# Patient Record
Sex: Female | Born: 1965 | Hispanic: No | Marital: Married | Smoking: Never smoker
Health system: Southern US, Community
[De-identification: ages and names within clinical notes are randomized; demographics above are authoritative.]

## PROBLEM LIST (undated history)

## (undated) DIAGNOSIS — Z1371 Encounter for nonprocreative screening for genetic disease carrier status: Secondary | ICD-10-CM

## (undated) DIAGNOSIS — I471 Supraventricular tachycardia, unspecified: Secondary | ICD-10-CM

## (undated) DIAGNOSIS — E039 Hypothyroidism, unspecified: Secondary | ICD-10-CM

## (undated) DIAGNOSIS — L719 Rosacea, unspecified: Secondary | ICD-10-CM

## (undated) DIAGNOSIS — Z803 Family history of malignant neoplasm of breast: Secondary | ICD-10-CM

## (undated) DIAGNOSIS — E282 Polycystic ovarian syndrome: Secondary | ICD-10-CM

## (undated) HISTORY — DX: Rosacea, unspecified: L71.9

## (undated) HISTORY — DX: Family history of malignant neoplasm of breast: Z80.3

## (undated) HISTORY — PX: TONSILLECTOMY AND ADENOIDECTOMY: SUR1326

## (undated) HISTORY — PX: HYSTEROSCOPY: SHX211

## (undated) HISTORY — PX: DILATION AND CURETTAGE OF UTERUS: SHX78

## (undated) HISTORY — DX: Encounter for nonprocreative screening for genetic disease carrier status: Z13.71

---

## 1997-06-10 HISTORY — PX: CARDIAC ELECTROPHYSIOLOGY MAPPING AND ABLATION: SHX1292

## 2001-06-10 HISTORY — PX: CHOLECYSTECTOMY: SHX55

## 2016-05-10 ENCOUNTER — Other Ambulatory Visit: Payer: Self-pay | Admitting: Certified Nurse Midwife

## 2016-05-10 DIAGNOSIS — Z1231 Encounter for screening mammogram for malignant neoplasm of breast: Secondary | ICD-10-CM

## 2016-05-23 ENCOUNTER — Encounter: Payer: Self-pay | Admitting: Radiology

## 2016-05-23 ENCOUNTER — Ambulatory Visit
Admission: RE | Admit: 2016-05-23 | Discharge: 2016-05-23 | Disposition: A | Discharge status: Home / Self Care | Payer: 59 | Source: Ambulatory Visit | Attending: Certified Nurse Midwife | Admitting: Certified Nurse Midwife

## 2016-05-23 DIAGNOSIS — Z1231 Encounter for screening mammogram for malignant neoplasm of breast: Secondary | ICD-10-CM

## 2016-05-29 ENCOUNTER — Inpatient Hospital Stay
Admission: RE | Admit: 2016-05-29 | Discharge: 2016-05-29 | Disposition: A | Discharge status: Home / Self Care | Payer: Self-pay | Source: Ambulatory Visit | Attending: *Deleted | Admitting: *Deleted

## 2016-05-29 ENCOUNTER — Other Ambulatory Visit: Payer: Self-pay | Admitting: *Deleted

## 2016-05-29 DIAGNOSIS — Z9289 Personal history of other medical treatment: Secondary | ICD-10-CM

## 2016-12-07 ENCOUNTER — Ambulatory Visit (INDEPENDENT_AMBULATORY_CARE_PROVIDER_SITE_OTHER): Payer: 59

## 2016-12-07 ENCOUNTER — Ambulatory Visit
Admission: EM | Admit: 2016-12-07 | Discharge: 2016-12-07 | Disposition: A | Discharge status: Home / Self Care | Payer: 59 | Attending: Family Medicine | Admitting: Family Medicine

## 2016-12-07 DIAGNOSIS — G8929 Other chronic pain: Secondary | ICD-10-CM | POA: Diagnosis not present

## 2016-12-07 DIAGNOSIS — S336XXA Sprain of sacroiliac joint, initial encounter: Secondary | ICD-10-CM

## 2016-12-07 DIAGNOSIS — M533 Sacrococcygeal disorders, not elsewhere classified: Secondary | ICD-10-CM

## 2016-12-07 HISTORY — DX: Polycystic ovarian syndrome: E28.2

## 2016-12-07 HISTORY — DX: Supraventricular tachycardia: I47.1

## 2016-12-07 HISTORY — DX: Hypothyroidism, unspecified: E03.9

## 2016-12-07 HISTORY — DX: Supraventricular tachycardia, unspecified: I47.10

## 2016-12-07 MED ORDER — TRAMADOL HCL 50 MG PO TABS: 50.0000 mg | ORAL_TABLET | Freq: Four times a day (QID) | ORAL | 0 refills | Status: DC | PRN

## 2016-12-07 MED ORDER — MELOXICAM 15 MG PO TABS: 15.0000 mg | ORAL_TABLET | Freq: Every day | ORAL | 0 refills | Status: DC

## 2016-12-07 NOTE — ED Triage Notes (Signed)
Pt having right hip pain radiating around her right side. Not due to an injury and did have this problem before a while back and said it got better on it's own. Hard to bare weight on her right side but said it does help to hold her right hip when she walks to alleviate the pain. Took 2 ibuprofen this morning without relief.

## 2016-12-07 NOTE — ED Provider Notes (Signed)
[facility]    CSN: [id number] Arrival date & time: 12/07/16  0844     History   Chief Complaint Chief Complaint  Patient presents with  . Hip Pain    HPI Hayley Hale is a 51 y.o. female.   The history is provided by the patient.  Hip Pain  This is a new problem. The current episode started yesterday. The problem occurs constantly. Pertinent negatives include no abdominal pain, no headaches and no shortness of breath. The symptoms are aggravated by twisting, standing and bending. Nothing relieves the symptoms.    Past Medical History:  Diagnosis Date  . Hypothyroidism   . PCOS (polycystic ovarian syndrome)   . SVT (supraventricular tachycardia) (HCC)     There are no active problems to display for this patient.   Past Surgical History:  Procedure Laterality Date  . CARDIAC ELECTROPHYSIOLOGY MAPPING AND ABLATION    . CHOLECYSTECTOMY    . TONSILLECTOMY      OB History    No data available       Home Medications    Prior to Admission medications   Medication Sig Start Date End Date Taking? Authorizing Provider  flecainide (TAMBOCOR) 50 MG tablet Take 50 mg by mouth 2 (two) times daily.   Yes [provider]  levothyroxine (SYNTHROID, LEVOTHROID) 200 MCG tablet Take 200 mcg by mouth daily before breakfast.   Yes [provider]  metFORMIN (GLUCOPHAGE) 1000 MG tablet Take 1,000 mg by mouth 2 (two) times daily with a meal.   Yes [provider]  metoprolol tartrate (LOPRESSOR) 25 MG tablet Take 25 mg by mouth 2 (two) times daily.   Yes [provider]  spironolactone (ALDACTONE) 50 MG tablet Take 50 mg by mouth daily.   Yes [provider]  meloxicam (MOBIC) 15 MG tablet Take 1 tablet (15 mg total) by mouth daily. 12/07/16   Juline Patch, MD  traMADol (ULTRAM) 50 MG tablet Take 1 tablet (50 mg total) by mouth every 6 (six) hours as needed. 12/07/16   Juline Patch, MD    Family History Family  History  Problem Relation Age of Onset  . Breast cancer Sister 15       pat half sister  . Breast cancer Maternal Grandmother 32    Social History Social History  Substance Use Topics  . Smoking status: Never Smoker  . Smokeless tobacco: Never Used  . Alcohol use No     Allergies   Patient has no known allergies.   Review of Systems Review of Systems  Constitutional: Negative.  Negative for chills, fatigue, fever and unexpected weight change.  HENT: Negative for congestion, ear discharge, ear pain, rhinorrhea, sinus pressure, sneezing and sore throat.   Eyes: Negative for photophobia, pain, discharge, redness and itching.  Respiratory: Negative for cough, shortness of breath, wheezing and stridor.   Gastrointestinal: Negative for abdominal pain, blood in stool, constipation, diarrhea, nausea and vomiting.  Endocrine: Negative for cold intolerance, heat intolerance, polydipsia, polyphagia and polyuria.  Genitourinary: Negative for dysuria, flank pain, frequency, hematuria, menstrual problem, pelvic pain, urgency, vaginal bleeding and vaginal discharge.  Musculoskeletal: Negative for arthralgias, back pain and myalgias.  Skin: Negative for rash.  Allergic/Immunologic: Negative for environmental allergies and food allergies.  Neurological: Negative for dizziness, weakness, light-headedness, numbness and headaches.  Hematological: Negative for adenopathy. Does not bruise/bleed easily.  Psychiatric/Behavioral: Negative for dysphoric mood. The patient is not nervous/anxious.      Physical Exam Triage  Vital Signs ED Triage Vitals  Enc Vitals Group     BP 12/07/16 0944 113/67     Pulse Rate 12/07/16 0944 85     Resp 12/07/16 0944 20     Temp 12/07/16 0944 98.5 F (36.9 C)     Temp Source 12/07/16 0944 Oral     SpO2 12/07/16 0944 99 %     Weight --      Height --      Head Circumference --      Peak Flow --      Pain Score 12/07/16 0947 7     Pain Loc --      Pain Edu?  --      Excl. in [place]? --    No data found.   Updated Vital Signs BP 113/67 (BP Location: Left Arm)   Pulse 85   Temp 98.5 F (36.9 C) (Oral)   Resp 20   SpO2 99%   Visual Acuity Right Eye Distance:   Left Eye Distance:   Bilateral Distance:    Right Eye Near:   Left Eye Near:    Bilateral Near:     Physical Exam  Constitutional: No distress.  HENT:  Head: Normocephalic and atraumatic.  Right Ear: External ear normal.  Left Ear: External ear normal.  Nose: Nose normal.  Mouth/Throat: Oropharynx is clear and moist.  Eyes: Conjunctivae and EOM are normal. Pupils are equal, round, and reactive to light. Right eye exhibits no discharge. Left eye exhibits no discharge.  Neck: Normal range of motion. Neck supple. No JVD present. No thyromegaly present.  Cardiovascular: Normal rate, regular rhythm, normal heart sounds and intact distal pulses.  Exam reveals no gallop and no friction rub.   No murmur heard. Pulmonary/Chest: Effort normal and breath sounds normal.  Abdominal: Soft. Bowel sounds are normal. She exhibits no mass. There is no tenderness. There is no guarding.  Musculoskeletal: Normal range of motion. She exhibits no edema.       Right hip: She exhibits tenderness.  Tender si  Lymphadenopathy:    She has no cervical adenopathy.  Neurological: She is alert. She has normal strength. No sensory deficit.  Reflex Scores:      Patellar reflexes are 2+ on the right side and 2+ on the left side. Skin: Skin is warm and dry. No rash noted. She is not diaphoretic.  Nursing note and vitals reviewed.    UC Treatments / Results  Labs (all labs ordered are listed, but only abnormal results are displayed) Labs Reviewed - No data to display  EKG  EKG Interpretation None       Radiology Dg Si Joints  Result Date: 12/07/2016 CLINICAL DATA:  RIGHT-side SI joint pain this morning after straining, similar episode last year EXAM: BILATERAL SACROILIAC JOINTS - 3+ VIEW  COMPARISON:  None FINDINGS: SI joints symmetric and preserved. Sacral foramina symmetric. Osseous mineralization grossly normal for technique. Probable bone island RIGHT ilium. No acute fracture, dislocation or bone destruction. Visualized lower lumbar spine unremarkable. IMPRESSION: No acute abnormalities. Electronically Signed   By: Lavonia Dana M.D.   On: 12/07/2016 10:47    Procedures Procedures (including critical care time)  Medications Ordered in UC Medications - No data to display   Initial Impression / Assessment and Plan / UC Course  I have reviewed the triage vital signs and the nursing notes.  Pertinent labs & imaging results that were available during my care of the patient were reviewed by me  and considered in my medical decision making (see chart for details).       Final Clinical Impressions(s) / UC Diagnoses   Final diagnoses:  Sacroiliac (ligament) sprain, initial encounter    New Prescriptions New Prescriptions   MELOXICAM (MOBIC) 15 MG TABLET    Take 1 tablet (15 mg total) by mouth daily.   TRAMADOL (ULTRAM) 50 MG TABLET    Take 1 tablet (50 mg total) by mouth every 6 (six) hours as needed.     Juline Patch, MD 12/07/16 1058

## 2017-02-19 ENCOUNTER — Encounter: Payer: Self-pay | Admitting: Family Medicine

## 2017-02-19 ENCOUNTER — Ambulatory Visit (INDEPENDENT_AMBULATORY_CARE_PROVIDER_SITE_OTHER): Payer: 59 | Admitting: Family Medicine

## 2017-02-19 VITALS — BP 100/62 | HR 68 | Ht 70.0 in | Wt 206.0 lb

## 2017-02-19 DIAGNOSIS — Z7689 Persons encountering health services in other specified circumstances: Secondary | ICD-10-CM | POA: Diagnosis not present

## 2017-02-19 DIAGNOSIS — Z23 Encounter for immunization: Secondary | ICD-10-CM

## 2017-02-19 NOTE — Patient Instructions (Signed)
Colonoscopy, Adult A colonoscopy is an exam to look at the entire large intestine. During the exam, a lubricated, bendable tube is inserted into the anus and then passed into the rectum, colon, and other parts of the large intestine. A colonoscopy is often done as a part of normal colorectal screening or in response to certain symptoms, such as anemia, persistent diarrhea, abdominal pain, and blood in the stool. The exam can help screen for and diagnose medical problems, including:  Tumors.  Polyps.  Inflammation.  Areas of bleeding.  Tell a health care provider about:  Any allergies you have.  All medicines you are taking, including vitamins, herbs, eye drops, creams, and over-the-counter medicines.  Any problems you or family members have had with anesthetic medicines.  Any blood disorders you have.  Any surgeries you have had.  Any medical conditions you have.  Any problems you have had passing stool. What are the risks? Generally, this is a safe procedure. However, problems may occur, including:  Bleeding.  A tear in the intestine.  A reaction to medicines given during the exam.  Infection (rare).  What happens before the procedure? Eating and drinking restrictions Follow instructions from your health care provider about eating and drinking, which may include:  A few days before the procedure - follow a low-fiber diet. Avoid nuts, seeds, dried fruit, raw fruits, and vegetables.  1-3 days before the procedure - follow a clear liquid diet. Drink only clear liquids, such as clear broth or bouillon, black coffee or tea, clear juice, clear soft drinks or sports drinks, gelatin dessert, and popsicles. Avoid any liquids that contain red or purple dye.  On the day of the procedure - do not eat or drink anything during the 2 hours before the procedure, or within the time period that your health care provider recommends.  Bowel prep If you were prescribed an oral bowel prep  to clean out your colon:  Take it as told by your health care provider. Starting the day before your procedure, you will need to drink a large amount of medicated liquid. The liquid will cause you to have multiple loose stools until your stool is almost clear or light green.  If your skin or anus gets irritated from diarrhea, you may use these to relieve the irritation: ? Medicated wipes, such as adult wet wipes with aloe and vitamin E. ? A skin soothing-product like petroleum jelly.  If you vomit while drinking the bowel prep, take a break for up to 60 minutes and then begin the bowel prep again. If vomiting continues and you cannot take the bowel prep without vomiting, call your health care provider.  General instructions  Ask your health care provider about changing or stopping your regular medicines. This is especially important if you are taking diabetes medicines or blood thinners.  Plan to have someone take you home from the hospital or clinic. What happens during the procedure?  An IV tube may be inserted into one of your veins.  You will be given medicine to help you relax (sedative).  To reduce your risk of infection: ? Your health care team will wash or sanitize their hands. ? Your anal area will be washed with soap.  You will be asked to lie on your side with your knees bent.  Your health care provider will lubricate a long, thin, flexible tube. The tube will have a camera and a light on the end.  The tube will be inserted into your   anus.  The tube will be gently eased through your rectum and colon.  Air will be delivered into your colon to keep it open. You may feel some pressure or cramping.  The camera will be used to take images during the procedure.  A small tissue sample may be removed from your body to be examined under a microscope (biopsy). If any potential problems are found, the tissue will be sent to a lab for testing.  If small polyps are found, your  health care provider may remove them and have them checked for cancer cells.  The tube that was inserted into your anus will be slowly removed. The procedure may vary among health care providers and hospitals. What happens after the procedure?  Your blood pressure, heart rate, breathing rate, and blood oxygen level will be monitored until the medicines you were given have worn off.  Do not drive for 24 hours after the exam.  You may have a small amount of blood in your stool.  You may pass gas and have mild abdominal cramping or bloating due to the air that was used to inflate your colon during the exam.  It is up to you to get the results of your procedure. Ask your health care provider, or the department performing the procedure, when your results will be ready. This information is not intended to replace advice given to you by your health care provider. Make sure you discuss any questions you have with your health care provider. Document Released: 05/24/2000 Document Revised: 03/27/2016 Document Reviewed: 08/08/2015 Elsevier Interactive Patient Education  2018 [employer]  

## 2017-02-19 NOTE — Progress Notes (Signed)
Name: Hayley Hale   MRN: [id number]    DOB: 10/20/1965   Date:02/19/2017       Progress Note  Subjective  Chief Complaint  Chief Complaint  Patient presents with  . Establish Care    was seen in urgent care for hip pain/ follow up from that    Patient presents to establish care.    No problem-specific Assessment & Plan notes found for this encounter.   Past Medical History:  Diagnosis Date  . Hypothyroidism   . PCOS (polycystic ovarian syndrome)   . SVT (supraventricular tachycardia) (HCC)     Past Surgical History:  Procedure Laterality Date  . CARDIAC ELECTROPHYSIOLOGY MAPPING AND ABLATION    . CHOLECYSTECTOMY    . HYSTEROSCOPY     x 1  . TONSILLECTOMY      Family History  Problem Relation Age of Onset  . Breast cancer Sister 63       pat half sister  . Cancer Sister   . Breast cancer Maternal Grandmother 43  . Cancer Maternal Grandmother   . Hypertension Mother   . Hypertension Father   . Cancer Paternal Grandmother     Social History   Social History  . Marital status: Married    Spouse name: N/A  . Number of children: N/A  . Years of education: N/A   Occupational History  . Not on file.   Social History Main Topics  . Smoking status: Never Smoker  . Smokeless tobacco: Never Used  . Alcohol use No  . Drug use: No  . Sexual activity: Yes   Other Topics Concern  . Not on file   Social History Narrative  . No narrative on file    No Known Allergies  Outpatient Medications Prior to Visit  Medication Sig Dispense Refill  . flecainide (TAMBOCOR) 50 MG tablet Take 50 mg by mouth 2 (two) times daily.    Marland Kitchen levothyroxine (SYNTHROID, LEVOTHROID) 200 MCG tablet Take 200 mcg by mouth daily before breakfast.    . metFORMIN (GLUCOPHAGE) 1000 MG tablet Take 1,000 mg by mouth 2 (two) times daily with a meal.    . metoprolol tartrate (LOPRESSOR) 25 MG tablet Take 12.5 mg by mouth 2 (two) times daily.     Marland Kitchen spironolactone (ALDACTONE) 50 MG tablet  Take 50 mg by mouth daily.    . meloxicam (MOBIC) 15 MG tablet Take 1 tablet (15 mg total) by mouth daily. (Patient not taking: Reported on 02/19/2017) 30 tablet 0  . traMADol (ULTRAM) 50 MG tablet Take 1 tablet (50 mg total) by mouth every 6 (six) hours as needed. (Patient not taking: Reported on 02/19/2017) 15 tablet 0   No facility-administered medications prior to visit.     Review of Systems  Constitutional: Negative for chills, fever, malaise/fatigue and weight loss.  HENT: Negative for ear discharge, ear pain and sore throat.   Eyes: Negative for blurred vision.  Respiratory: Negative for cough, sputum production, shortness of breath and wheezing.   Cardiovascular: Positive for palpitations. Negative for chest pain and leg swelling.  Gastrointestinal: Negative for abdominal pain, blood in stool, constipation, diarrhea, heartburn, melena and nausea.  Genitourinary: Negative for dysuria, frequency, hematuria and urgency.       Hx nephrolithiasis  Musculoskeletal: Positive for back pain and joint pain. Negative for myalgias and neck pain.  Skin: Negative for rash.  Neurological: Positive for headaches. Negative for dizziness, tingling, sensory change and focal weakness.       Sinus  Endo/Heme/Allergies: Negative for environmental allergies and polydipsia. Does not bruise/bleed easily.  Psychiatric/Behavioral: Negative for depression and suicidal ideas. The patient is not nervous/anxious and does not have insomnia.      Objective  Vitals:   02/19/17 1005  BP: 100/62  Pulse: 68  Weight: 206 lb (93.4 kg)  Height: 5\' 10"  (1.778 m)    Physical Exam  Constitutional: She is well-developed, well-nourished, and in no distress. No distress.  HENT:  Head: Normocephalic and atraumatic.  Right Ear: External ear normal.  Left Ear: External ear normal.  Nose: Nose normal.  Mouth/Throat: Oropharynx is clear and moist.  Eyes: Pupils are equal, round, and reactive to light. Conjunctivae  and EOM are normal. Right eye exhibits no discharge. Left eye exhibits no discharge.  Neck: Normal range of motion. Neck supple. No JVD present. No thyromegaly present.  Cardiovascular: Normal rate, regular rhythm, normal heart sounds and intact distal pulses.  Exam reveals no gallop and no friction rub.   No murmur heard. Pulmonary/Chest: Effort normal and breath sounds normal. She has no wheezes. She has no rales.  Abdominal: Soft. Bowel sounds are normal. She exhibits no mass. There is no tenderness. There is no guarding.  Musculoskeletal: Normal range of motion. She exhibits no edema.  Lymphadenopathy:    She has no cervical adenopathy.  Neurological: She is alert. She has normal reflexes.  Skin: Skin is warm and dry. She is not diaphoretic.  Psychiatric: Mood and affect normal.  Nursing note and vitals reviewed.     Assessment & Plan  Problem List Items Addressed This Visit    None    Visit Diagnoses    Establishing care with new doctor, encounter for    -  Primary   Influenza vaccine needed       Relevant Orders   Flu Vaccine QUAD 6+ mos PF IM (Fluarix Quad PF) (Completed)      No orders of the defined types were placed in this encounter.     Dr. Macon [facility]  02/19/17

## 2017-02-21 ENCOUNTER — Ambulatory Visit: Payer: Self-pay | Admitting: Family Medicine

## 2017-04-09 ENCOUNTER — Other Ambulatory Visit: Payer: Self-pay | Admitting: Certified Nurse Midwife

## 2017-04-09 DIAGNOSIS — Z1231 Encounter for screening mammogram for malignant neoplasm of breast: Secondary | ICD-10-CM

## 2017-05-10 DIAGNOSIS — Z803 Family history of malignant neoplasm of breast: Secondary | ICD-10-CM

## 2017-05-10 DIAGNOSIS — Z1371 Encounter for nonprocreative screening for genetic disease carrier status: Secondary | ICD-10-CM

## 2017-05-10 HISTORY — DX: Family history of malignant neoplasm of breast: Z80.3

## 2017-05-10 HISTORY — DX: Encounter for nonprocreative screening for genetic disease carrier status: Z13.71

## 2017-05-12 ENCOUNTER — Ambulatory Visit (INDEPENDENT_AMBULATORY_CARE_PROVIDER_SITE_OTHER): Payer: 59 | Admitting: Certified Nurse Midwife

## 2017-05-12 ENCOUNTER — Encounter: Payer: Self-pay | Admitting: Certified Nurse Midwife

## 2017-05-12 VITALS — BP 110/66 | HR 67 | Ht 70.0 in | Wt 208.0 lb

## 2017-05-12 DIAGNOSIS — Z1211 Encounter for screening for malignant neoplasm of colon: Secondary | ICD-10-CM | POA: Diagnosis not present

## 2017-05-12 DIAGNOSIS — Z803 Family history of malignant neoplasm of breast: Secondary | ICD-10-CM | POA: Insufficient documentation

## 2017-05-12 DIAGNOSIS — I471 Supraventricular tachycardia: Secondary | ICD-10-CM | POA: Insufficient documentation

## 2017-05-12 DIAGNOSIS — Z01419 Encounter for gynecological examination (general) (routine) without abnormal findings: Secondary | ICD-10-CM | POA: Diagnosis not present

## 2017-05-12 DIAGNOSIS — Z1239 Encounter for other screening for malignant neoplasm of breast: Secondary | ICD-10-CM

## 2017-05-12 DIAGNOSIS — Z124 Encounter for screening for malignant neoplasm of cervix: Secondary | ICD-10-CM

## 2017-05-12 DIAGNOSIS — L719 Rosacea, unspecified: Secondary | ICD-10-CM | POA: Insufficient documentation

## 2017-05-12 DIAGNOSIS — Z23 Encounter for immunization: Secondary | ICD-10-CM

## 2017-05-12 DIAGNOSIS — E282 Polycystic ovarian syndrome: Secondary | ICD-10-CM | POA: Insufficient documentation

## 2017-05-12 DIAGNOSIS — E039 Hypothyroidism, unspecified: Secondary | ICD-10-CM | POA: Insufficient documentation

## 2017-05-12 NOTE — Progress Notes (Signed)
Gynecology Annual Exam  PCP: Juline Patch, MD  Chief Complaint:  Chief Complaint  Patient presents with  . Gynecologic Exam    History of Present Illness:Hayley Hale is a 51 year old Caucasian/White female , G 5 P 2 0 3 2 , who presents for her annual exam . She is having problems with irregular menses and hot flashes.  Her menses went back to being infrequent this past year. They are every 60-75 days apart, last 3-4 days and are light and her LMP was 04/29/2017.   She denies dysmenorrhea. She occasionally feels some pressure in her pelvis and uses ibuprofen with symptomatic relief.  The patient's past medical history is notable for a history of hypothyroidism, PCOS, rosacea, and PSVT.  Since her last annual GYN exam dated 05/09/2016, she has had no significant changes in her health history. Has been having some problems with pain in her right SI joint.  She is sexually active. She is currently using condoms for contraception.  Her most recent pap smear was obtained 05/09/2016 and was NIL/ negative HRHPV. Her most recent mammogram obtained on 05/23/2016 was normal.  There is a positive history of breast cancer in her maternal grandmother and half sister. Genetic testing has been done. The half sister tested negative for BRCA 1 and negative for BRCA 2.  There is no family history of ovarian cancer.  The patient does do occ self breast exams.  The patient does not smoke.  The patient does not drink alcohol.  The patient does not use illegal drugs.  The patient exercises most days of the week. The patient does get adequate calcium in her diet.  She had a recent cholesterol screen a few years ago that was normal per patient report.    The patient denies current symptoms of depression.    Review of Systems: Review of Systems  Constitutional: Negative for chills, fever and weight loss.  HENT: Negative for congestion, sinus pain and sore throat.   Eyes: Negative for  blurred vision and pain.  Respiratory: Negative for hemoptysis, shortness of breath and wheezing.   Cardiovascular: Negative for chest pain, palpitations and leg swelling.  Gastrointestinal: Negative for abdominal pain, blood in stool, diarrhea, heartburn, nausea and vomiting.  Genitourinary: Negative for dysuria, frequency, hematuria and urgency.       Positive for oligomenorrhea  Musculoskeletal: Positive for back pain (right SI joint pain). Negative for joint pain and myalgias.  Skin: Negative for itching and rash.  Neurological: Negative for dizziness, tingling and headaches.  Endo/Heme/Allergies: Positive for environmental allergies. Negative for polydipsia. Does not bruise/bleed easily.       Positive for hot flashes   Psychiatric/Behavioral: Negative for depression. The patient is not nervous/anxious and does not have insomnia.     Past Medical History:  Past Medical History:  Diagnosis Date  . Hypothyroidism   . PCOS (polycystic ovarian syndrome)   . Rosacea   . SVT (supraventricular tachycardia) (HCC)     Past Surgical History:  Past Surgical History:  Procedure Laterality Date  . CARDIAC ELECTROPHYSIOLOGY [place] AND ABLATION  1999  . CHOLECYSTECTOMY  06/2001  . DILATION AND CURETTAGE OF UTERUS  multiple in 1997-98  . HYSTEROSCOPY     x 1  . TONSILLECTOMY AND ADENOIDECTOMY      Family History:  Family History  Problem Relation Age of Onset  . Breast cancer Sister 3       pat half sister  . Breast  cancer Maternal Grandmother 7  . Hypertension Mother   . Hyperlipidemia Mother   . Hypertension Father   . Hyperlipidemia Father   . Pancreatic cancer Paternal Grandmother 34  . Colon cancer Other     Social History:  Social History   Socioeconomic History  . Marital status: Married    Spouse name: Ruthann Cancer  . Number of children: 2  . Years of education: Not on file  . Highest education level: Not on file  Social Needs  . Financial resource strain: Not  on file  . Food insecurity - worry: Not on file  . Food insecurity - inability: Not on file  . Transportation needs - medical: Not on file  . Transportation needs - non-medical: Not on file  Occupational History  . Occupation: Homemaker  Tobacco Use  . Smoking status: Never Smoker  . Smokeless tobacco: Never Used  Substance and Sexual Activity  . Alcohol use: No  . Drug use: No  . Sexual activity: Yes    Partners: Male    Birth control/protection: Condom  Other Topics Concern  . Not on file  Social History Narrative  . Not on file    Allergies:  No Known Allergies  Medications: Prior to Admission medications   Medication Sig Start Date End Date Taking? Authorizing Provider  B Complex Vitamins (VITAMIN-B COMPLEX) TABS Take 1 tablet by mouth daily.    [provider]  Cholecalciferol (VITAMIN D3) 5000 units CAPS Take 1 capsule by mouth daily.    [provider]  flecainide (TAMBOCOR) 50 MG tablet Take 50 mg by mouth 2 (two) times daily.    [provider]  Ivermectin (SOOLANTRA) 1 % CREA Apply 1 application topically daily. Dr Phillip Heal    [provider]  levothyroxine (SYNTHROID, LEVOTHROID) 200 MCG tablet Take 200 mcg by mouth daily before breakfast.    [provider]  meloxicam (MOBIC) 15 MG tablet Take 1 tablet (15 mg total) by mouth daily. Patient not taking: Reported on 02/19/2017 12/07/16   Juline Patch, MD  metFORMIN (GLUCOPHAGE) 1000 MG tablet Take 1,000 mg by mouth 2 (two) times daily with a meal.    [provider]  metoprolol tartrate (LOPRESSOR) 25 MG tablet Take 12.5 mg by mouth 2 (two) times daily.     [provider]  spironolactone (ALDACTONE) 50 MG tablet Take 50 mg by mouth daily.    [provider]  traMADol (ULTRAM) 50 MG tablet Take 1 tablet (50 mg total) by mouth every 6 (six) hours as needed. Patient not taking: Reported on 02/19/2017 12/07/16   Juline Patch, MD  tretinoin (RETIN-A)  0.1 % cream Apply topically at bedtime. Dr Phillip Heal    [provider]    Physical Exam Vitals: BP 110/66   Pulse 67   Ht '5\' 10"'$  (1.778 m)   Wt 208 lb (94.3 kg)   LMP 04/29/2017 (Exact Date)   BMI 29.84 kg/m   General: WF in NAD HEENT: normocephalic, anicteric Neck: no thyroid enlargement, no palpable nodules, no cervical lymphadenopathy  Pulmonary: No increased work of breathing, CTAB Cardiovascular: RRR, without murmur  Breast: Breast symmetrical, no tenderness, no palpable nodules or masses, no skin or nipple retraction present, no nipple discharge.  No axillary, infraclavicular or supraclavicular lymphadenopathy. Abdomen: Soft, non-tender, non-distended.  Umbilicus without lesions.  No hepatomegaly or masses palpable. No evidence of hernia. Genitourinary:  External: Normal external female genitalia.  Normal urethral meatus, normal Bartholin's and Skene's glands.  Vagina: Normal vaginal mucosa, no evidence of prolapse.    Cervix: Grossly normal in appearance, no bleeding, non-tender  Uterus: Anteverted, normal size, shape, and consistency, mobile, and non-tender  Adnexa: No adnexal masses, non-tender  Rectal: deferred  Lymphatic: no evidence of inguinal lymphadenopathy Extremities: no edema, erythema, or tenderness Neurologic: Grossly intact Psychiatric: mood appropriate, affect full     Assessment: 51 y.o. annual gyn exam Oligomenorrhea/ vasomotor symptoms-perimenopausal Family history of breast cancer Desires TDAP vaccine   Plan:   1) Breast cancer screening - recommend monthly self breast exam and annual screening mammograms. Mammogram was ordered today. Patient to call for appointment at [facility]. Recommended MYRISK testing with family history of breast cancer. Patient desires testing today. Blood work drawn. RTO in 6 weeks for test results  2) Colon cancer screening: discussed options for colon cancer screening. Desires referral for colonoscopy  3)  Cervical cancer screening - Pap was done.   4) Contraception - advised needs to continue use of condoms until no menses x 1 year  5) Routine healthcare maintenance including cholesterol and diabetes screening managed by PCP . TDAP given  Dalia Heading, CNM

## 2017-05-12 NOTE — Patient Instructions (Addendum)
[place] testing done today-return to office in 6 weeks for results  You will be contacted regarding a [facility] gastroenterology appointment for a colonoscopy

## 2017-05-15 LAB — IGP,RFX APTIMA HPV ALL PTH: PAP Smear Comment: 0

## 2017-05-27 ENCOUNTER — Encounter: Payer: Self-pay | Admitting: Obstetrics and Gynecology

## 2017-06-11 ENCOUNTER — Ambulatory Visit
Admission: RE | Admit: 2017-06-11 | Discharge: 2017-06-11 | Disposition: A | Discharge status: Home / Self Care | Payer: Managed Care, Other (non HMO) | Source: Ambulatory Visit | Attending: Certified Nurse Midwife | Admitting: Certified Nurse Midwife

## 2017-06-11 DIAGNOSIS — Z1231 Encounter for screening mammogram for malignant neoplasm of breast: Secondary | ICD-10-CM | POA: Diagnosis not present

## 2017-06-25 ENCOUNTER — Encounter: Payer: Self-pay | Admitting: Certified Nurse Midwife

## 2017-06-25 ENCOUNTER — Ambulatory Visit: Payer: Managed Care, Other (non HMO) | Admitting: Certified Nurse Midwife

## 2017-06-25 VITALS — BP 102/70 | HR 65 | Ht 70.0 in | Wt 207.0 lb

## 2017-06-25 DIAGNOSIS — Z803 Family history of malignant neoplasm of breast: Secondary | ICD-10-CM

## 2017-06-25 DIAGNOSIS — Z1379 Encounter for other screening for genetic and chromosomal anomalies: Secondary | ICD-10-CM

## 2017-06-25 NOTE — Progress Notes (Signed)
  History of Present Illness:  Hayley Hale is a 52 y.o. who presents for her MYRISK test results.  PMHx: She  has a past medical history of BRCA negative (05/2017), Family history of breast cancer (05/2017), Hypothyroidism, PCOS (polycystic ovarian syndrome), Rosacea, and SVT (supraventricular tachycardia) ([place]). Also,  has a past surgical history that includes Cardiac electrophysiology mapping and ablation (1999); Cholecystectomy (06/2001); Hysteroscopy; Dilation and curettage of uterus (multiple in 1997-98); and Tonsillectomy and adenoidectomy., family history includes Breast cancer (age of onset: 43) in her maternal grandmother; Breast cancer (age of onset: 75) in her sister; Colon cancer in her other; Hyperlipidemia in her father and mother; Hypertension in her father and mother; Pancreatic cancer (age of onset: 33) in her paternal grandmother.,  reports that  has never smoked. she has never used smokeless tobacco. She reports that she does not drink alcohol or use drugs.  She has a current medication list which includes the following prescription(s): vitamin-b complex, vitamin d3, flecainide, ivermectin, levothyroxine, loratadine, metformin, metoprolol tartrate, spironolactone, tazarotene, and triamcinolone. Also, has No Known Allergies.  ROS  Physical Exam:  BP 102/70   Pulse 65   Ht '5\' 10"'$  (1.778 m)   Wt 93.9 kg (207 lb)   LMP 05/28/2017 (Exact Date) Comment: periods are irregular not preg  BMI 29.70 kg/m  Body mass index is 29.7 kg/m. Constitutional: Well nourished, well developed female in no acute distress.  Abdomen: diffusely non tender to palpation, non distended, and no masses, hernias Neuro: Grossly intact Psych:  Normal mood and affect.   Results: MyRISK test was negative. Her life time risk of breast cancer according to [facility] is 18.9%.  The lifetime risk of breast cancer according to the [facility] is 25.4%.  Assessment: Family history of breast cancer  Negative MYRISK  testing, but lifetime risk of breast cancer between 18.9% and 25.4%  Plan: Discussed increased surveillance available for breast cancer in the form of MRIs of the breast and twice a year professional breast exams. 3D mammogram 06/11/2017 was negative. Desires MRI of the breast-will call for referral after she completes her colonoscopy Given copy of the results for her resords  Dalia Heading, [facility], [facility] 06/25/2017  10:36 AM

## 2017-06-26 ENCOUNTER — Encounter: Payer: Self-pay | Admitting: Certified Nurse Midwife

## 2017-08-05 ENCOUNTER — Ambulatory Visit: Payer: Managed Care, Other (non HMO) | Admitting: Certified Registered Nurse Anesthetist

## 2017-08-05 ENCOUNTER — Encounter
Admission: RE | Disposition: A | Discharge status: Home / Self Care | Payer: Self-pay | Source: Ambulatory Visit | Attending: Internal Medicine

## 2017-08-05 ENCOUNTER — Ambulatory Visit
Admission: RE | Admit: 2017-08-05 | Discharge: 2017-08-05 | Disposition: A | Discharge status: Home / Self Care | Payer: Managed Care, Other (non HMO) | Source: Ambulatory Visit | Attending: Internal Medicine | Admitting: Internal Medicine

## 2017-08-05 ENCOUNTER — Encounter: Payer: Self-pay | Admitting: Certified Registered Nurse Anesthetist

## 2017-08-05 ENCOUNTER — Other Ambulatory Visit: Payer: Self-pay

## 2017-08-05 DIAGNOSIS — E282 Polycystic ovarian syndrome: Secondary | ICD-10-CM | POA: Diagnosis not present

## 2017-08-05 DIAGNOSIS — Z8371 Family history of colonic polyps: Secondary | ICD-10-CM | POA: Diagnosis not present

## 2017-08-05 DIAGNOSIS — Q438 Other specified congenital malformations of intestine: Secondary | ICD-10-CM | POA: Diagnosis not present

## 2017-08-05 DIAGNOSIS — E039 Hypothyroidism, unspecified: Secondary | ICD-10-CM | POA: Diagnosis not present

## 2017-08-05 DIAGNOSIS — K640 First degree hemorrhoids: Secondary | ICD-10-CM | POA: Diagnosis not present

## 2017-08-05 DIAGNOSIS — Z79899 Other long term (current) drug therapy: Secondary | ICD-10-CM | POA: Insufficient documentation

## 2017-08-05 DIAGNOSIS — Z7984 Long term (current) use of oral hypoglycemic drugs: Secondary | ICD-10-CM | POA: Diagnosis not present

## 2017-08-05 DIAGNOSIS — I471 Supraventricular tachycardia: Secondary | ICD-10-CM | POA: Diagnosis not present

## 2017-08-05 DIAGNOSIS — Z1211 Encounter for screening for malignant neoplasm of colon: Secondary | ICD-10-CM | POA: Insufficient documentation

## 2017-08-05 HISTORY — PX: COLONOSCOPY WITH PROPOFOL: SHX5780

## 2017-08-05 LAB — POCT PREGNANCY, URINE: PREG TEST UR: NEGATIVE

## 2017-08-05 SURGERY — COLONOSCOPY WITH PROPOFOL
Anesthesia: General

## 2017-08-05 MED ORDER — SODIUM CHLORIDE 0.9 % IV SOLN
INTRAVENOUS | Status: DC
  Administered 2017-08-05: 13:00:00 via INTRAVENOUS

## 2017-08-05 MED ORDER — PROPOFOL 500 MG/50ML IV EMUL
INTRAVENOUS | Status: DC | PRN
  Administered 2017-08-05: 200 ug/kg/min via INTRAVENOUS

## 2017-08-05 MED ORDER — LIDOCAINE HCL (PF) 2 % IJ SOLN
INTRAMUSCULAR | Status: AC
  Filled 2017-08-05: qty 10

## 2017-08-05 MED ORDER — PROPOFOL 500 MG/50ML IV EMUL
INTRAVENOUS | Status: AC
  Filled 2017-08-05: qty 50

## 2017-08-05 MED ORDER — LIDOCAINE HCL (CARDIAC) 20 MG/ML IV SOLN
INTRAVENOUS | Status: DC | PRN
  Administered 2017-08-05: 50 mg via INTRAVENOUS

## 2017-08-05 MED ORDER — PROPOFOL 10 MG/ML IV BOLUS
INTRAVENOUS | Status: DC | PRN
  Administered 2017-08-05: 60 mg via INTRAVENOUS
  Administered 2017-08-05 (×2): 20 mg via INTRAVENOUS

## 2017-08-05 NOTE — Anesthesia Post-op Follow-up Note (Signed)
Anesthesia QCDR form completed.        

## 2017-08-05 NOTE — Interval H&P Note (Signed)
History and Physical Interval Note:  08/05/2017 1:09 PM  Hayley Hale  has presented today for surgery, with the diagnosis of FAM HX POLYPS  The various methods of treatment have been discussed with the patient and family. After consideration of risks, benefits and other options for treatment, the patient has consented to  Procedure(s): COLONOSCOPY WITH PROPOFOL (N/A) as a surgical intervention .  The patient's history has been reviewed, patient examined, no change in status, stable for surgery.  I have reviewed the patient's chart and labs.  Questions were answered to the patient's satisfaction.     [place], [place]

## 2017-08-05 NOTE — Anesthesia Procedure Notes (Signed)
Date/Time: 08/05/2017 1:20 PM Performed by: Johnna Acosta, CRNA Pre-anesthesia Checklist: Patient identified, Emergency Drugs available, Suction available, Patient being monitored and Timeout performed Patient Re-evaluated:Patient Re-evaluated prior to induction Oxygen Delivery Method: Nasal cannula Preoxygenation: Pre-oxygenation with 100% oxygen

## 2017-08-05 NOTE — Op Note (Signed)
[facility] Patient Name: Hayley Hale Procedure Date: 08/05/2017 1:17 PM MRN: [id number] Account #: [id number] Date of Birth: February 22, 1966 Admit Type: Outpatient Age: 52 Room: [facility] ENDO ROOM 2 Gender: Female Note Status: Finalized Procedure:            Colonoscopy Indications:          Colon cancer screening in patient at increased risk:                        Family history of 1st-degree relative with colon polyps Providers:            Benay Pike. [name] MD, MD Referring MD:         Juline Patch, MD (Referring MD) Medicines:            Propofol per Anesthesia Complications:        No immediate complications. Procedure:            Pre-Anesthesia Assessment:                       - The risks and benefits of the procedure and the                        sedation options and risks were discussed with the                        patient. All questions were answered and informed                        consent was obtained.                       - Patient identification and proposed procedure were                        verified prior to the procedure by the nurse. The                        procedure was verified in the procedure room.                       - ASA Grade Assessment: II - A patient with mild                        systemic disease.                       - After reviewing the risks and benefits, the patient                        was deemed in satisfactory condition to undergo the                        procedure.                       After obtaining informed consent, the colonoscope was                        passed under direct vision. Throughout the procedure,  the patient's blood pressure, pulse, and oxygen                        saturations were monitored continuously. The                        Colonoscope was introduced through the anus and                        advanced to the the cecum, identified by  appendiceal                        orifice and ileocecal valve. The colonoscopy was                        technically difficult and complex due to significant                        looping. Successful completion of the procedure was                        aided by applying abdominal pressure. The patient                        tolerated the procedure well. The quality of the bowel                        preparation was good. Findings:      The perianal and digital rectal examinations were normal. Pertinent       negatives include normal sphincter tone and no palpable rectal lesions.      The colon (entire examined portion) appeared normal.      Non-bleeding internal hemorrhoids were found during retroflexion. The       hemorrhoids were Grade I (internal hemorrhoids that do not prolapse).      The exam was otherwise without abnormality. Impression:           - The entire examined colon is normal.                       - Non-bleeding internal hemorrhoids.                       - The examination was otherwise normal.                       - No specimens collected. Recommendation:       - Patient has a contact number available for                        emergencies. The signs and symptoms of potential                        delayed complications were discussed with the patient.                        Return to normal activities tomorrow. Written discharge                        instructions were provided to the patient.                       -  Resume previous diet.                       - Continue present medications.                       - Repeat colonoscopy in 5 years for screening purposes.                       - Return to GI office PRN.                       - The findings and recommendations were discussed with                        the patient and their spouse. Procedure Code(s):    --- Professional ---                       X6468, Colorectal cancer screening; colonoscopy on                         individual at high risk Diagnosis Code(s):    --- Professional ---                       Z83.71, Family history of colonic polyps                       K64.0, First degree hemorrhoids CPT copyright 2016 American Medical Association. All rights reserved. The codes documented in this report are preliminary and upon coder review may  be revised to meet current compliance requirements. Efrain Sella MD, MD 08/05/2017 1:52:22 PM This report has been signed electronically. Number of Addenda: 0 Note Initiated On: 08/05/2017 1:17 PM Scope Withdrawal Time: 0 hours 8 minutes 32 seconds  Total Procedure Duration: 0 hours 22 minutes 42 seconds       [facility]

## 2017-08-05 NOTE — Anesthesia Preprocedure Evaluation (Signed)
Anesthesia Evaluation  Patient identified by MRN, date of birth, ID band Patient awake    Reviewed: Allergy & Precautions, NPO status , Patient's Chart, lab work & pertinent test results  History of Anesthesia Complications Negative for: history of anesthetic complications  Airway Mallampati: III  TM Distance: >3 FB Neck ROM: Full    Dental no notable dental hx.    Pulmonary neg pulmonary ROS, neg sleep apnea, neg COPD,    breath sounds clear to auscultation- rhonchi (-) wheezing      Cardiovascular Exercise Tolerance: Good (-) hypertension(-) CAD and (-) Past MI  Rhythm:Regular Rate:Normal - Systolic murmurs and - Diastolic murmurs    Neuro/Psych negative neurological ROS  negative psych ROS   GI/Hepatic negative GI ROS, Neg liver ROS,   Endo/Other  neg diabetesHypothyroidism   Renal/GU negative Renal ROS     Musculoskeletal negative musculoskeletal ROS (+)   Abdominal (+) - obese,   Peds  Hematology negative hematology ROS (+)   Anesthesia Other Findings Past Medical History: 05/2017: BRCA negative     Comment:  MyRisk neg 05/2017: Family history of breast cancer     Comment:  riskscore=18.9%/IBIS=25.4% No date: Hypothyroidism No date: PCOS (polycystic ovarian syndrome) No date: Rosacea No date: SVT (supraventricular tachycardia) (HCC)   Reproductive/Obstetrics                             Anesthesia Physical Anesthesia Plan  ASA: II  Anesthesia Plan: General   Post-op Pain Management:    Induction: Intravenous  PONV Risk Score and Plan: 2 and Propofol infusion  Airway Management Planned: Natural Airway  Additional Equipment:   Intra-op Plan:   Post-operative Plan:   Informed Consent: I have reviewed the patients History and Physical, chart, labs and discussed the procedure including the risks, benefits and alternatives for the proposed anesthesia with the patient or  authorized representative who has indicated his/her understanding and acceptance.   Dental advisory given  Plan Discussed with: CRNA and Anesthesiologist  Anesthesia Plan Comments:         Anesthesia Quick Evaluation

## 2017-08-05 NOTE — Anesthesia Postprocedure Evaluation (Signed)
Anesthesia Post Note  Patient: Hayley Hale  Procedure(s) Performed: COLONOSCOPY WITH PROPOFOL (N/A )  Patient location during evaluation: Endoscopy Anesthesia Type: General Level of consciousness: awake and alert and oriented Pain management: pain level controlled Vital Signs Assessment: post-procedure vital signs reviewed and stable Respiratory status: spontaneous breathing, nonlabored ventilation and respiratory function stable Cardiovascular status: blood pressure returned to baseline and stable Postop Assessment: no signs of nausea or vomiting Anesthetic complications: no     Last Vitals:  Vitals:   08/05/17 1410 08/05/17 1420  BP: 117/67 119/72  Pulse: 62 62  Resp: 18 (!) 21  Temp:    SpO2: 100% 100%    Last Pain:  Vitals:   08/05/17 1350  TempSrc: Tympanic                 Devony Mcgrady

## 2017-08-05 NOTE — Transfer of Care (Signed)
Immediate Anesthesia Transfer of Care Note  Patient: Hayley Hale  Procedure(s) Performed: COLONOSCOPY WITH PROPOFOL (N/A )  Patient Location: PACU  Anesthesia Type:General  Level of Consciousness: awake, alert  and oriented  Airway & Oxygen Therapy: Patient Spontanous Breathing and Patient connected to nasal cannula oxygen  Post-op Assessment: Report given to RN and Post -op Vital signs reviewed and stable  Post vital signs: Reviewed and stable  Last Vitals:  Vitals:   08/05/17 1252 08/05/17 1350  BP: 128/78 114/68  Pulse: 66 85  Resp: 18 (!) 22  Temp: 36.9 C 36.6 C  SpO2: 100% 100%    Last Pain:  Vitals:   08/05/17 1350  TempSrc: Tympanic      Patients Stated Pain Goal: 6 (00/93/81 8299)  Complications: No apparent anesthesia complications

## 2017-08-05 NOTE — H&P (Signed)
Outpatient short stay form Pre-procedure 08/05/2017 9:26 AM Hayley Hale K. Hayley Hale, M.D.  Primary Physician: Hayley Hale, CNM  Reason for visit:  Family hx of colon polyps.  History of present illness:  Patient has a family history of colon polyps in her mother. Patient presents for increased risk colonoscopy for screening.. The patient denies complaints of abdominal pain, significant change in bowel habits, or rectal bleeding.    No current facility-administered medications for this encounter.   Current Outpatient Medications:  .  B Complex Vitamins (VITAMIN-B COMPLEX) TABS, Take 1 tablet by mouth daily., Disp: , Rfl:  .  Cholecalciferol (VITAMIN D3) 5000 units CAPS, Take 1 capsule by mouth daily., Disp: , Rfl:  .  flecainide (TAMBOCOR) 50 MG tablet, Take 50 mg by mouth 2 (two) times daily., Disp: , Rfl:  .  Ivermectin (SOOLANTRA) 1 % CREA, Apply 1 application topically daily. Dr Hayley Hale, Disp: , Rfl:  .  levothyroxine (SYNTHROID, LEVOTHROID) 200 MCG tablet, Take 200 mcg by mouth daily before breakfast., Disp: , Rfl:  .  loratadine (CLARITIN) 10 MG tablet, Take 10 mg by mouth., Disp: , Rfl:  .  metFORMIN (GLUCOPHAGE) 1000 MG tablet, Take 1,000 mg by mouth 2 (two) times daily with a meal., Disp: , Rfl:  .  metoprolol tartrate (LOPRESSOR) 25 MG tablet, Take 12.5 mg by mouth 2 (two) times daily. , Disp: , Rfl:  .  spironolactone (ALDACTONE) 50 MG tablet, Take 50 mg by mouth daily., Disp: , Rfl:  .  tazarotene (AVAGE) 0.1 % cream, APPLY TO AFFECTED AREA S  TOPICALLY ONCE DAILY, Disp: , Rfl: 6 .  triamcinolone (NASACORT) 55 MCG/ACT AERO nasal inhaler, Place into the nose., Disp: , Rfl:   No medications prior to admission.     No Known Allergies   Past Medical History:  Diagnosis Date  . BRCA negative 05/2017   MyRisk neg  . Family history of breast cancer 05/2017   riskscore=18.9%/IBIS=25.4%  . Hypothyroidism   . PCOS (polycystic ovarian syndrome)   . Rosacea   . SVT  (supraventricular tachycardia) (HCC)     Review of systems:      Physical Exam  General appearance: alert, cooperative and appears stated age Resp: clear to auscultation bilaterally Cardio: regular rate and rhythm, S1, S2 normal, no murmur, click, rub or gallop GI: soft, non-tender; bowel sounds normal; no masses,  no organomegaly Extremities: extremities normal, atraumatic, no cyanosis or edema     Planned procedures: Colonoscopy. The patient understands the nature of the planned procedure, indications, risks, alternatives and potential complications including but not limited to bleeding, infection, perforation, damage to internal organs and possible oversedation/side effects from anesthesia. The patient agrees and gives consent to proceed.  Please refer to procedure notes for findings, recommendations and patient disposition/instructions.    Hayley Hale K. Hayley Hale, M.D. Gastroenterology 08/05/2017  9:26 AM

## 2017-08-06 ENCOUNTER — Encounter: Payer: Self-pay | Admitting: Internal Medicine

## 2017-09-16 ENCOUNTER — Other Ambulatory Visit: Payer: Self-pay | Admitting: Family Medicine

## 2017-10-28 ENCOUNTER — Encounter: Payer: Self-pay | Admitting: Family Medicine

## 2017-10-28 ENCOUNTER — Ambulatory Visit: Payer: Managed Care, Other (non HMO) | Admitting: Family Medicine

## 2017-10-28 VITALS — BP 112/80 | HR 64 | Ht 70.0 in | Wt 202.0 lb

## 2017-10-28 DIAGNOSIS — G5631 Lesion of radial nerve, right upper limb: Secondary | ICD-10-CM | POA: Diagnosis not present

## 2017-10-28 DIAGNOSIS — G5621 Lesion of ulnar nerve, right upper limb: Secondary | ICD-10-CM | POA: Diagnosis not present

## 2017-10-28 DIAGNOSIS — I471 Supraventricular tachycardia: Secondary | ICD-10-CM

## 2017-10-28 MED ORDER — MELOXICAM 15 MG PO TABS: 15.0000 mg | ORAL_TABLET | Freq: Every day | ORAL | 0 refills | Status: DC

## 2017-10-28 NOTE — Patient Instructions (Signed)
Guyon Tunnel Syndrome Guyon tunnel syndrome, also known as cyclist's palsy, is a disorder that causes pain, weakness, and numbness in the wrist and the hand. Pain occurs in the outer (ulnar) part of the wrist and the palm of the hand, including the ring finger and pinkie. This condition happens when a nerve in the arm and hand (ulnar nerve) is stretched or squeezed (compressed) at the base of the hand. Over time, you may start to have symptoms with just a small amount of stress to your hand or wrist. When it takes less stress to cause symptoms than it used to, this is called sensitization. Guyon tunnel syndrome is common among cyclists, because gripping and leaning on bicycle handlebars for long periods of time can cause this condition and make it worse over time. What are the causes? This condition may be caused by:  Repetitive pressure on the hands and wrists.  An injuryto the base of the hand that causes swelling or a break (fracture) in a wrist bone (hamate bone).  What increases the risk? The following factors may make you more likely to develop this condition:  Having diabetes.  Having hypothyroidism.  Participating in activities that involve repeated impact, pressure, or vibration of the hands or wrists. These include certain sports, such as cycling, tennis, and martial arts.  Repeatedly bearing weight in your hands, such as when you use crutches.  Having gout or rheumatoid arthritis.  Having a ganglion cyst or lipoma near the base of the hand.  Having carpal tunnel syndrome.  What are the signs or symptoms? Symptoms of this condition may include:  Tingling, numbness, or a burning feeling in the ulnar side of the palm and in the ring finger and pinkie.  Pain in the hand or wrist. Pain may feel sharp, or it may feel like an ache.  Weakness in the hand. You may have difficulty gripping objects.  Involuntary bending of the ring finger and pinkie toward the palm (claw  hand).  How is this diagnosed? This condition may be diagnosed based on:  A physical exam.  Your medical history.  Tests, such as: ? Nerve conduction test. This test checks the quality of signals along your ulnar nerve. ? X-rays. ? CT scan. ? Ultrasound. This uses sound waves to make an image of your affected area.  How is this treated? Treatment for this condition may include:  Resting the injured area. This may include stopping or modifying any sports and physical activity for a period of time.  Icing the injured area.  Medicines that help to relieve pain and inflammation.  A splint to keep your wrist and hand in the proper position.  Physical therapy.  An injection of medicine (cortisone) that helps to reduce inflammation.  Surgery to relieve pressure on the ulnar nerve. This is done only in very severe cases.  Follow these instructions at home: If you have a splint:  Do not put pressure on any part of the splint until it is fully hardened. This may take several hours.  Wear it as told by your health care provider. Remove it only as told by your health care provider.  Loosen the splint if your fingers tingle, become numb, or turn cold and blue.  Do not let your splint get wet if it is not waterproof. ? If your splint is not waterproof, cover it with a watertight covering when you take a bath or shower.  Keep the splint clean.  Ask your health care provider  when it is safe for you to drive. Managing pain, stiffness, and swelling  If directed, apply ice to the injured area: ? Put ice in a plastic bag. ? Place a towel between your skin and the bag. ? Leave the ice on for 20 minutes, 2-3 times a day. Activity  Return to your normal activities as told by your health care provider. Ask your health care provider what activities are safe for you.  When you do activities that cause stress to your hands and wrists, try wearing padded gloves. Change your hand  positions often, especially when you do these activities for a long time.  If physical therapy was prescribed, do exercises as told by your health care provider. General instructions  Take over-the-counter and prescription medicines only as told by your health care provider.  Keep all follow-up visits as told by your health care provider. This is important. Contact a health care provider if:  You have pain, tingling, or weakness that gets worse.  Your symptoms do not improve after 2 weeks of treatment. This information is not intended to replace advice given to you by your health care provider. Make sure you discuss any questions you have with your health care provider. Document Released: 05/27/2005 Document Revised: 01/24/2016 Document Reviewed: 02/06/2015 Elsevier Interactive Patient Education  [employer].

## 2017-10-28 NOTE — Progress Notes (Signed)
Name: Hayley Hale   MRN: [id number]    DOB: 19-Oct-1965   Date:10/28/2017       Progress Note  Subjective  Chief Complaint  Chief Complaint  Patient presents with  . hand numbness    started with the ring finger and pinky finger- now has a pain that runs down R) arm into elbow    Arm Pain   The incident occurred more than 1 week ago. There was no injury mechanism. The pain is present in the upper right arm. The quality of the pain is described as aching. The pain radiates to the right hand (no neck pain). The pain is at a severity of 5/10. The pain is mild. The pain has been fluctuating since the incident. Associated symptoms include tingling. Pertinent negatives include no chest pain, muscle weakness or numbness. The symptoms are aggravated by movement. Treatments tried: positioning. The treatment provided no relief.  Neurologic Problem  The patient's primary symptoms include focal sensory loss. The patient's pertinent negatives include no altered mental status, clumsiness, focal weakness, loss of balance, memory loss, near-syncope, slurred speech, syncope, visual change or weakness. This is a chronic problem. The current episode started more than 1 month ago. The neurological problem developed gradually. The problem has been gradually worsening since onset. There was right-sided and upper extremity focality noted. Associated symptoms include a fever, headaches and neck pain. Pertinent negatives include no abdominal pain, back pain, chest pain, dizziness, nausea, palpitations or shortness of breath. There is no history of a bleeding disorder, a clotting disorder, a CVA or dementia.    No problem-specific Assessment & Plan notes found for this encounter.   Past Medical History:  Diagnosis Date  . BRCA negative 05/2017   MyRisk neg  . Family history of breast cancer 05/2017   riskscore=18.9%/IBIS=25.4%  . Hypothyroidism   . PCOS (polycystic ovarian syndrome)   . Rosacea   . SVT  (supraventricular tachycardia) (HCC)     Past Surgical History:  Procedure Laterality Date  . CARDIAC ELECTROPHYSIOLOGY [place] AND ABLATION  1999  . CHOLECYSTECTOMY  06/2001  . COLONOSCOPY WITH PROPOFOL N/A 08/05/2017   Procedure: COLONOSCOPY WITH PROPOFOL;  Surgeon: Toledo, Benay Pike, MD;  Location: [facility] ENDOSCOPY;  Service: Gastroenterology;  Laterality: N/A;  . DILATION AND CURETTAGE OF UTERUS  multiple in 1997-98  . HYSTEROSCOPY     x 1  . TONSILLECTOMY AND ADENOIDECTOMY      Family History  Problem Relation Age of Onset  . Breast cancer Sister 48       pat half sister  . Breast cancer Maternal Grandmother 91  . Hypertension Mother   . Hyperlipidemia Mother   . Hypertension Father   . Hyperlipidemia Father   . Pancreatic cancer Paternal Grandmother 29  . Colon cancer Other     Social History   Socioeconomic History  . Marital status: Married    Spouse name: Ruthann Cancer  . Number of children: 2  . Years of education: Not on file  . Highest education level: Not on file  Occupational History  . Occupation: Agricultural engineer  Social Needs  . Financial resource strain: Not on file  . Food insecurity:    Worry: Not on file    Inability: Not on file  . Transportation needs:    Medical: Not on file    Non-medical: Not on file  Tobacco Use  . Smoking status: Never Smoker  . Smokeless tobacco: Never Used  Substance and Sexual Activity  . Alcohol use:  No  . Drug use: No  . Sexual activity: Yes    Partners: Male    Birth control/protection: Condom  Lifestyle  . Physical activity:    Days per week: 5 days    Minutes per session: 60 min  . Stress: Not at all  Relationships  . Social connections:    Talks on phone: More than three times a week    Gets together: Once a week    Attends religious service: Never    Active member of club or organization: Yes    Attends meetings of clubs or organizations: More than 4 times per year    Relationship status: Married  . Intimate  partner violence:    Fear of current or ex partner: No    Emotionally abused: No    Physically abused: No    Forced sexual activity: No  Other Topics Concern  . Not on file  Social History Narrative  . Not on file    No Known Allergies  Outpatient Medications Prior to Visit  Medication Sig Dispense Refill  . B Complex Vitamins (VITAMIN-B COMPLEX) TABS Take 1 tablet by mouth daily.    . cetirizine (ZYRTEC) 10 MG tablet Take 10 mg by mouth daily.    . Cholecalciferol (VITAMIN D3) 5000 units CAPS Take 1 capsule by mouth daily.    . flecainide (TAMBOCOR) 50 MG tablet Take 50 mg by mouth 3 (three) times daily. Dr Burt Knack    . Ivermectin (SOOLANTRA) 1 % CREA Apply 1 application topically daily. Dr Phillip Heal    . levothyroxine (SYNTHROID, LEVOTHROID) 200 MCG tablet Take 200 mcg by mouth daily before breakfast.    . metFORMIN (GLUCOPHAGE) 1000 MG tablet Take 1,000 mg by mouth 2 (two) times daily with a meal.    . metoprolol tartrate (LOPRESSOR) 25 MG tablet Take 12.5 mg by mouth 2 (two) times daily.     Marland Kitchen spironolactone (ALDACTONE) 50 MG tablet Take 50 mg by mouth daily.    . tazarotene (AVAGE) 0.1 % cream APPLY TO AFFECTED AREA S  TOPICALLY ONCE DAILY  6  . triamcinolone (NASACORT) 55 MCG/ACT AERO nasal inhaler Place into the nose.    . loratadine (CLARITIN) 10 MG tablet Take 10 mg by mouth.     No facility-administered medications prior to visit.     Review of Systems  Constitutional: Positive for fever. Negative for chills, malaise/fatigue and weight loss.  HENT: Negative for ear discharge, ear pain and sore throat.   Eyes: Negative for blurred vision.  Respiratory: Negative for cough, sputum production, shortness of breath and wheezing.   Cardiovascular: Negative for chest pain, palpitations, leg swelling and near-syncope.  Gastrointestinal: Negative for abdominal pain, blood in stool, constipation, diarrhea, heartburn, melena and nausea.  Genitourinary: Negative for dysuria, frequency,  hematuria and urgency.  Musculoskeletal: Positive for neck pain. Negative for back pain, joint pain and myalgias.  Skin: Negative for rash.  Neurological: Positive for tingling and headaches. Negative for dizziness, sensory change, focal weakness, syncope, weakness, numbness and loss of balance.  Endo/Heme/Allergies: Negative for environmental allergies and polydipsia. Does not bruise/bleed easily.  Psychiatric/Behavioral: Negative for depression, memory loss and suicidal ideas. The patient is not nervous/anxious and does not have insomnia.      Objective  Vitals:   10/28/17 0903  BP: 112/80  Pulse: 64  Weight: 202 lb (91.6 kg)  Height: '5\' 10"'$  (1.778 m)    Physical Exam  Constitutional: She is oriented to person, place, and time. She  appears well-developed and well-nourished.  HENT:  Head: Normocephalic.  Right Ear: External ear normal.  Left Ear: External ear normal.  Mouth/Throat: Oropharynx is clear and moist.  Eyes: Pupils are equal, round, and reactive to light. Conjunctivae and EOM are normal. Lids are everted and swept, no foreign bodies found. Left eye exhibits no hordeolum. No foreign body present in the left eye. Right conjunctiva is not injected. Left conjunctiva is not injected. No scleral icterus.  Neck: Normal range of motion. Neck supple. No JVD present. No tracheal deviation present. No thyromegaly present.  Cardiovascular: Normal rate, regular rhythm, normal heart sounds and intact distal pulses. Exam reveals no gallop and no friction rub.  No murmur heard. Pulmonary/Chest: Effort normal and breath sounds normal. No respiratory distress. She has no wheezes. She has no rales.  Abdominal: Soft. Bowel sounds are normal. She exhibits no mass. There is no hepatosplenomegaly. There is no tenderness. There is no rebound and no guarding.  Musculoskeletal: Normal range of motion. She exhibits no edema or tenderness.       Right elbow: Normal.      Left elbow: Normal.        Right forearm: Normal.       Left forearm: Normal.  Lymphadenopathy:       Head (right side): No submandibular adenopathy present.       Head (left side): No submandibular adenopathy present.    She has no cervical adenopathy.    She has no axillary adenopathy.  Neurological: She is alert and oriented to person, place, and time. She has normal strength. She displays normal reflexes. No cranial nerve deficit or sensory deficit.  Reflex Scores:      Tricep reflexes are 2+ on the right side and 2+ on the left side.      Bicep reflexes are 2+ on the right side and 2+ on the left side.      Brachioradialis reflexes are 2+ on the right side and 2+ on the left side.      Patellar reflexes are 2+ on the right side and 2+ on the left side.      Achilles reflexes are 2+ on the right side and 2+ on the left side. Skin: Skin is warm. No rash noted.  Psychiatric: She has a normal mood and affect. Her mood appears not anxious. She does not exhibit a depressed mood.  Nursing note and vitals reviewed.     Assessment & Plan  Problem List Items Addressed This Visit      Cardiovascular and Mediastinum   SVT (supraventricular tachycardia) (HCC)     Nervous and Auditory   Radial neuropathy, right - Primary   Relevant Medications   meloxicam (MOBIC) 15 MG tablet   Ulnar neuritis, right   Relevant Medications   meloxicam (MOBIC) 15 MG tablet      Meds ordered this encounter  Medications  . meloxicam (MOBIC) 15 MG tablet    Sig: Take 1 tablet (15 mg total) by mouth daily.    Dispense:  60 tablet    Refill:  0      Dr. Otilio Miu [facility]  10/28/17

## 2017-11-20 ENCOUNTER — Other Ambulatory Visit: Payer: Self-pay | Admitting: Family Medicine

## 2017-11-20 DIAGNOSIS — G5631 Lesion of radial nerve, right upper limb: Secondary | ICD-10-CM

## 2017-11-20 DIAGNOSIS — G5621 Lesion of ulnar nerve, right upper limb: Secondary | ICD-10-CM

## 2017-12-22 ENCOUNTER — Encounter: Payer: Self-pay | Admitting: Family Medicine

## 2017-12-25 ENCOUNTER — Other Ambulatory Visit: Payer: Self-pay

## 2017-12-25 DIAGNOSIS — G5691 Unspecified mononeuropathy of right upper limb: Secondary | ICD-10-CM

## 2018-04-28 ENCOUNTER — Other Ambulatory Visit: Payer: Self-pay | Admitting: Certified Nurse Midwife

## 2018-04-28 DIAGNOSIS — Z1231 Encounter for screening mammogram for malignant neoplasm of breast: Secondary | ICD-10-CM

## 2018-05-28 NOTE — Progress Notes (Signed)
Gynecology Annual Exam  PCP: Juline Patch, MD  Chief Complaint:  Chief Complaint  Patient presents with  . Gynecologic Exam    History of Present Illness:Hayley Hale is a 52 year old Caucasian/White female , G 5 P 2 0 3 2 , who presents for her annual exam . She is having problems with irregular menses and hot flashes.  They are every 1-4 months apart, last 7-8 days (spotting for first 4 days) and are medium flow and her LMP was 03/10/2018.   She has dysmenorrhea x 1 day. She uses ibuprofen with symptomatic relief.  The patient's past medical history is notable for a history of hypothyroidism, PCOS, rosacea, and PSVT.  Since her last annual GYN exam dated 05/12/2017, she has had no significant changes in her health history. She is sexually active. She is currently using condoms for contraception.  Her most recent pap smear was obtained 05/12/2017 and was NIL. Her most recent mammogram obtained on 06/11/2017 was normal.  There is a positive history of breast cancer in her maternal grandmother and half sister. Genetic testing has been done. The half sister tested negative for BRCA 1 and negative for BRCA 2. The patient had negative MYRISK testing last year, but her risk of breast cancer was between 18.9% and 25.4%. There is no family history of ovarian cancer. She had a colonoscopy 08/05/2017 and she reports that it was normal. She is due another in 5 years (family hx of polyps) The patient does do occ self breast exams.  The patient does not smoke.  The patient does not drink alcohol.  The patient does not use illegal drugs.  The patient exercises most days of the week. The patient does get adequate calcium in her diet.  She had a recent cholesterol screen a few years ago that was normal per patient report.    The patient denies current symptoms of depression.    Review of Systems: Review of Systems  Constitutional: Negative for chills, fever and weight loss.  HENT:  Negative for congestion, sinus pain and sore throat.   Eyes: Positive for redness (and irritation). Negative for blurred vision and pain.  Respiratory: Negative for hemoptysis, shortness of breath and wheezing.   Cardiovascular: Negative for chest pain, palpitations and leg swelling.  Gastrointestinal: Negative for abdominal pain, blood in stool, diarrhea, heartburn, nausea and vomiting.  Genitourinary: Negative for dysuria, frequency, hematuria and urgency.       Positive for oligomenorrhea  Musculoskeletal: Negative for back pain (right SI joint pain), joint pain and myalgias.  Skin: Negative for itching and rash.  Neurological: Negative for dizziness, tingling and headaches.  Endo/Heme/Allergies: Positive for environmental allergies. Negative for polydipsia. Does not bruise/bleed easily.       Positive for hot flashes   Psychiatric/Behavioral: Negative for depression. The patient is not nervous/anxious and does not have insomnia.     Past Medical History:  Past Medical History:  Diagnosis Date  . BRCA negative 05/2017   MyRisk neg  . Family history of breast cancer 05/2017   riskscore=18.9%/IBIS=25.4%  . Hypothyroidism   . PCOS (polycystic ovarian syndrome)   . Rosacea   . SVT (supraventricular tachycardia) (HCC)     Past Surgical History:  Past Surgical History:  Procedure Laterality Date  . CARDIAC ELECTROPHYSIOLOGY [place] AND ABLATION  1999  . CHOLECYSTECTOMY  06/2001  . COLONOSCOPY WITH PROPOFOL N/A 08/05/2017   Procedure: COLONOSCOPY WITH PROPOFOL;  Surgeon: [name], Benay Pike, MD;  Location: [facility] ENDOSCOPY;  Service: Gastroenterology;  Laterality: N/A;  . DILATION AND CURETTAGE OF UTERUS  multiple in 1997-98  . HYSTEROSCOPY     x 1  . TONSILLECTOMY AND ADENOIDECTOMY      Family History:  Family History  Problem Relation Age of Onset  . Breast cancer Sister 93       pat half sister  . Breast cancer Maternal Grandmother 38  . Hypertension Mother   .  Hyperlipidemia Mother   . Hypertension Father   . Hyperlipidemia Father   . Pancreatic cancer Paternal Grandmother 69  . Colon cancer Other     Social History:  Social History   Socioeconomic History  . Marital status: Married    Spouse name: Ruthann Cancer  . Number of children: 2  . Years of education: Not on file  . Highest education level: Not on file  Occupational History  . Occupation: Agricultural engineer  Social Needs  . Financial resource strain: Not on file  . Food insecurity:    Worry: Not on file    Inability: Not on file  . Transportation needs:    Medical: Not on file    Non-medical: Not on file  Tobacco Use  . Smoking status: Never Smoker  . Smokeless tobacco: Never Used  Substance and Sexual Activity  . Alcohol use: No  . Drug use: No  . Sexual activity: Yes    Partners: Male    Birth control/protection: Condom  Lifestyle  . Physical activity:    Days per week: 5 days    Minutes per session: 60 min  . Stress: Not at all  Relationships  . Social connections:    Talks on phone: More than three times a week    Gets together: Once a week    Attends religious service: Never    Active member of club or organization: Yes    Attends meetings of clubs or organizations: More than 4 times per year    Relationship status: Married  . Intimate partner violence:    Fear of current or ex partner: No    Emotionally abused: No    Physically abused: No    Forced sexual activity: No  Other Topics Concern  . Not on file  Social History Narrative  . Not on file    Allergies:  No Known Allergies  Medications: Prior to Admission medications   Medication Sig Start Date End Date Taking? Authorizing Provider  B Complex Vitamins (VITAMIN-B COMPLEX) TABS Take 1 tablet by mouth daily.    [provider]  Cholecalciferol (VITAMIN D3) 5000 units CAPS Take 1 capsule by mouth daily.    [provider]  flecainide (TAMBOCOR) 50 MG tablet Take 50 mg by mouth 2 (two)  times daily.    [provider]  Ivermectin (SOOLANTRA) 1 % CREA Apply 1 application topically daily. Dr Phillip Heal    [provider]  levothyroxine (SYNTHROID, LEVOTHROID) 200 MCG tablet Take 200 mcg by mouth daily before breakfast.    [provider]  meloxicam (MOBIC) 15 MG tablet Take 1 tablet (15 mg total) by mouth daily. Patient not taking: Reported on 02/19/2017 12/07/16   Juline Patch, MD  metFORMIN (GLUCOPHAGE) 1000 MG tablet Take 1,000 mg by mouth 2 (two) times daily with a meal.    [provider]  metoprolol tartrate (LOPRESSOR) 25 MG tablet Take 12.5 mg by mouth 2 (two) times daily.     [provider]  spironolactone (ALDACTONE) 50 MG  tablet Take 50 mg by mouth daily.    [provider]  traMADol (ULTRAM) 50 MG tablet Take 1 tablet (50 mg total) by mouth every 6 (six) hours as needed. Patient not taking: Reported on 02/19/2017 12/07/16   Juline Patch, MD  tretinoin (RETIN-A) 0.1 % cream Apply topically at bedtime. Dr Phillip Heal    [provider]    Physical Exam Vitals: BP 110/70   Ht '5\' 10"'$  (1.778 m)   Wt 205 lb (93 kg)   LMP 03/10/2018   BMI 29.41 kg/m  General: WF in NAD HEENT: normocephalic, anicteric Neck: no thyroid enlargement, no palpable nodules, no cervical lymphadenopathy  Pulmonary: No increased work of breathing, CTAB Cardiovascular: RRR, without murmur  Breast: Breast symmetrical, no tenderness, no palpable nodules or masses, no skin or nipple retraction present. Reports having a stain on her bra from a lubricant from her Kinnie Scales glands-for years.Unable to elicit a nipple discharge.  No axillary, infraclavicular or supraclavicular lymphadenopathy. Abdomen: Soft, non-tender, non-distended.  Umbilicus without lesions.  No hepatomegaly or masses palpable. No evidence of hernia. Genitourinary:  External: Normal external female genitalia.  Normal urethral meatus, normal Bartholin's and Skene's glands.     Vagina: Normal vaginal mucosa, no evidence of prolapse.    Cervix: Grossly normal in appearance, no bleeding, non-tender  Uterus: Anteverted, normal size, shape, and consistency, mobile, and non-tender  Adnexa: No adnexal masses, non-tender  Rectal: deferred  Lymphatic: no evidence of inguinal lymphadenopathy Extremities: no edema, erythema, or tenderness Neurologic: Grossly intact Psychiatric: mood appropriate, affect full     Assessment: 52 y.o. annual gyn exam Oligomenorrhea/ vasomotor symptoms-perimenopausal Family history of breast cancer with increased lifetime risk of breast cancer Desires flu vaccine  Plan:   1) Breast cancer screening - recommend monthly self breast exam and annual screening mammograms. Mammogram was ordered today. Patient to call [facility]. Would like to schedule breast MRI this year. Will wait to get results from mammogram to schedule MRI.   2) Colon cancer screening: up to date  3) Cervical cancer screening - Pap was done.   4) Contraception - needs to continue use of condoms until no menses x 1 year  5) Routine healthcare maintenance including cholesterol and diabetes screening managed by endocrinologist . Flu vaccine given.  6) RTO 1 year and prn  Dalia Heading, CNM

## 2018-05-29 ENCOUNTER — Encounter: Payer: Self-pay | Admitting: Certified Nurse Midwife

## 2018-05-29 ENCOUNTER — Ambulatory Visit (INDEPENDENT_AMBULATORY_CARE_PROVIDER_SITE_OTHER): Payer: Managed Care, Other (non HMO) | Admitting: Certified Nurse Midwife

## 2018-05-29 ENCOUNTER — Other Ambulatory Visit (HOSPITAL_COMMUNITY)
Admission: RE | Admit: 2018-05-29 | Discharge: 2018-05-29 | Disposition: A | Discharge status: Home / Self Care | Payer: Managed Care, Other (non HMO) | Source: Ambulatory Visit | Attending: Certified Nurse Midwife | Admitting: Certified Nurse Midwife

## 2018-05-29 VITALS — BP 110/70 | Ht 70.0 in | Wt 205.0 lb

## 2018-05-29 DIAGNOSIS — Z1239 Encounter for other screening for malignant neoplasm of breast: Secondary | ICD-10-CM

## 2018-05-29 DIAGNOSIS — Z124 Encounter for screening for malignant neoplasm of cervix: Secondary | ICD-10-CM | POA: Diagnosis present

## 2018-05-29 DIAGNOSIS — Z01419 Encounter for gynecological examination (general) (routine) without abnormal findings: Secondary | ICD-10-CM | POA: Diagnosis not present

## 2018-05-29 DIAGNOSIS — Z9189 Other specified personal risk factors, not elsewhere classified: Secondary | ICD-10-CM

## 2018-05-29 DIAGNOSIS — Z23 Encounter for immunization: Secondary | ICD-10-CM | POA: Diagnosis not present

## 2018-05-29 DIAGNOSIS — Z1231 Encounter for screening mammogram for malignant neoplasm of breast: Secondary | ICD-10-CM

## 2018-05-31 DIAGNOSIS — Z9189 Other specified personal risk factors, not elsewhere classified: Secondary | ICD-10-CM | POA: Insufficient documentation

## 2018-06-01 ENCOUNTER — Other Ambulatory Visit: Payer: Self-pay

## 2018-06-05 LAB — CYTOLOGY - PAP: DIAGNOSIS: NEGATIVE

## 2018-06-15 ENCOUNTER — Encounter: Payer: Self-pay | Admitting: Radiology

## 2018-06-15 ENCOUNTER — Ambulatory Visit
Admission: RE | Admit: 2018-06-15 | Discharge: 2018-06-15 | Disposition: A | Discharge status: Home / Self Care | Payer: Managed Care, Other (non HMO) | Source: Ambulatory Visit | Attending: Certified Nurse Midwife | Admitting: Certified Nurse Midwife

## 2018-06-15 DIAGNOSIS — Z1231 Encounter for screening mammogram for malignant neoplasm of breast: Secondary | ICD-10-CM

## 2018-06-24 ENCOUNTER — Telehealth: Payer: Self-pay | Admitting: Certified Nurse Midwife

## 2018-06-24 NOTE — Telephone Encounter (Signed)
I spoke to the patient, who said she had a rec'd a letter from [employer], stating that the authorization request w/ [employer] was pended for clnical information. Patient wanted to be sure everything was okay for her appointment on Monday. I had an authorization# [id number], but double-checked [employer] while the patient was on the phone, and confirmed the authorization was approved.

## 2018-06-24 NOTE — Telephone Encounter (Signed)
Patient is calling to speak with Izora Gala about her MRI. Please call patient. Thank you!

## 2018-06-29 ENCOUNTER — Ambulatory Visit
Admission: RE | Admit: 2018-06-29 | Discharge: 2018-06-29 | Disposition: A | Discharge status: Home / Self Care | Payer: Managed Care, Other (non HMO) | Source: Ambulatory Visit | Attending: Certified Nurse Midwife | Admitting: Certified Nurse Midwife

## 2018-06-29 DIAGNOSIS — Z1231 Encounter for screening mammogram for malignant neoplasm of breast: Secondary | ICD-10-CM | POA: Insufficient documentation

## 2018-06-29 MED ORDER — GADOBUTROL 1 MMOL/ML IV SOLN
9.0000 mL | Freq: Once | INTRAVENOUS | Status: AC | PRN
  Administered 2018-06-29: 9 mL via INTRAVENOUS

## 2018-09-10 ENCOUNTER — Ambulatory Visit: Payer: Managed Care, Other (non HMO) | Admitting: Family Medicine

## 2018-09-10 ENCOUNTER — Encounter: Payer: Self-pay | Admitting: Family Medicine

## 2018-09-10 ENCOUNTER — Other Ambulatory Visit: Payer: Self-pay

## 2018-09-10 VITALS — BP 130/80 | HR 76 | Ht 70.0 in | Wt 202.0 lb

## 2018-09-10 DIAGNOSIS — H60321 Hemorrhagic otitis externa, right ear: Secondary | ICD-10-CM | POA: Diagnosis not present

## 2018-09-10 MED ORDER — OFLOXACIN 0.3 % OT SOLN: 5.0000 [drp] | Freq: Every day | OTIC | 0 refills | Status: DC

## 2018-09-10 NOTE — Progress Notes (Signed)
Date:  09/10/2018   Name:  Hayley Hale   DOB:  [birth year]   MRN:  [id number]   Chief Complaint: ear bleeding (can feel "something in there" has seen blood on pillow from R) ear)  Otalgia   There is pain in the right (for bleeding from ear with no pain) ear. This is a new problem. The current episode started yesterday. The problem occurs constantly. Progression since onset: continues. There has been no fever. The patient is experiencing no pain. Associated symptoms include ear discharge, headaches and hearing loss. Pertinent negatives include no abdominal pain, coughing, diarrhea, neck pain, rash, rhinorrhea, sore throat or vomiting. She has tried nothing for the symptoms. There is no history of a chronic ear infection, hearing loss or a tympanostomy tube.    Review of Systems  Constitutional: Negative.  Negative for chills, fatigue, fever and unexpected weight change.  HENT: Positive for ear discharge and hearing loss. Negative for congestion, ear pain, postnasal drip, rhinorrhea, sinus pressure, sinus pain, sneezing and sore throat.   Eyes: Negative for photophobia, pain, discharge, redness and itching.  Respiratory: Negative for cough, shortness of breath, wheezing and stridor.   Gastrointestinal: Negative for abdominal pain, blood in stool, constipation, diarrhea, nausea and vomiting.  Endocrine: Negative for cold intolerance, heat intolerance, polydipsia, polyphagia and polyuria.  Genitourinary: Negative for dysuria, flank pain, frequency, hematuria, menstrual problem, pelvic pain, urgency, vaginal bleeding and vaginal discharge.  Musculoskeletal: Negative for arthralgias, back pain, myalgias and neck pain.  Skin: Negative for rash.  Allergic/Immunologic: Negative for environmental allergies and food allergies.  Neurological: Positive for headaches. Negative for dizziness, weakness, light-headedness and numbness.  Hematological: Negative for adenopathy. Does not bruise/bleed easily.   Psychiatric/Behavioral: Negative for dysphoric mood. The patient is not nervous/anxious.     Patient Active Problem List   Diagnosis Date Noted  . Increased risk of breast cancer 05/31/2018  . Radial neuropathy, right 10/28/2017  . Ulnar neuritis, right 10/28/2017  . Family history of breast cancer 05/12/2017  . PCOS (polycystic ovarian syndrome) 05/12/2017  . SVT (supraventricular tachycardia) ([place]) 05/12/2017  . Rosacea   . Hypothyroidism     No Known Allergies  Past Surgical History:  Procedure Laterality Date  . CARDIAC ELECTROPHYSIOLOGY [place] AND ABLATION  1999  . CHOLECYSTECTOMY  06/2001  . COLONOSCOPY WITH PROPOFOL N/A 08/05/2017   Procedure: COLONOSCOPY WITH PROPOFOL;  Surgeon: Toledo, Benay Pike, MD;  Location: [facility] ENDOSCOPY;  Service: Gastroenterology;  Laterality: N/A;  . DILATION AND CURETTAGE OF UTERUS  multiple in 1997-98  . HYSTEROSCOPY     x 1  . TONSILLECTOMY AND ADENOIDECTOMY      Social History   Tobacco Use  . Smoking status: Never Smoker  . Smokeless tobacco: Never Used  Substance Use Topics  . Alcohol use: No  . Drug use: No     Medication list has been reviewed and updated.  Current Meds  Medication Sig  . B Complex Vitamins (VITAMIN-B COMPLEX) TABS Take 1 tablet by mouth daily.  . cetirizine (ZYRTEC) 10 MG tablet Take 10 mg by mouth daily.  . Cholecalciferol (VITAMIN D3) 5000 units CAPS Take 1 capsule by mouth daily.  . flecainide (TAMBOCOR) 50 MG tablet Take 50 mg by mouth 3 (three) times daily. Dr Burt Knack  . Ivermectin (SOOLANTRA) 1 % CREA Apply 1 application topically daily. Dr Phillip Heal  . levothyroxine (SYNTHROID, LEVOTHROID) 175 MCG tablet Take 175 mcg by mouth daily before breakfast. Dr Leonides Schanz  . metFORMIN (GLUCOPHAGE) 1000  MG tablet Take 1,000 mg by mouth 2 (two) times daily with a meal. [place] [facility]  . metoprolol tartrate (LOPRESSOR) 25 MG tablet Take 12.5 mg by mouth 2 (two) times daily. cooper  . spironolactone (ALDACTONE)  50 MG tablet Take 50 mg by mouth daily. [place] [facility]  . tazarotene (AVAGE) 0.1 % cream derm  . triamcinolone (NASACORT) 55 MCG/ACT AERO nasal inhaler Place into the nose.    PHQ 2/9 Scores 10/28/2017 02/19/2017  PHQ - 2 Score 0 0  PHQ- 9 Score 0 0    BP Readings from Last 3 Encounters:  09/10/18 130/80  05/29/18 110/70  10/28/17 112/80    Physical Exam Vitals signs and nursing note reviewed.  Constitutional:      General: She is not in acute distress.    Appearance: She is not diaphoretic.  HENT:     Head: Normocephalic and atraumatic.     Right Ear: Tympanic membrane and external ear normal. Drainage present. No middle ear effusion. There is no impacted cerumen. No hemotympanum. Tympanic membrane is not injected or perforated.     Left Ear: External ear normal. No drainage.  No middle ear effusion. There is no impacted cerumen. No hemotympanum. Tympanic membrane is retracted. Tympanic membrane is not injected or perforated.     Ears:     Comments: Coagulation canal/with fresh blood proximal    Nose: Nose normal.  Eyes:     General:        Right eye: No discharge.        Left eye: No discharge.     Conjunctiva/sclera: Conjunctivae normal.     Pupils: Pupils are equal, round, and reactive to light.  Neck:     Musculoskeletal: Normal range of motion and neck supple.     Thyroid: No thyromegaly.     Vascular: No JVD.  Cardiovascular:     Rate and Rhythm: Normal rate and regular rhythm.     Heart sounds: Normal heart sounds. No murmur. No friction rub. No gallop.   Pulmonary:     Effort: Pulmonary effort is normal.     Breath sounds: Normal breath sounds.  Abdominal:     General: Bowel sounds are normal.     Palpations: Abdomen is soft. There is no mass.     Tenderness: There is no abdominal tenderness. There is no guarding.  Musculoskeletal: Normal range of motion.  Lymphadenopathy:     Cervical: No cervical adenopathy.  Skin:    General: Skin is warm and dry.   Neurological:     Mental Status: She is alert.     Deep Tendon Reflexes: Reflexes are normal and symmetric.     Wt Readings from Last 3 Encounters:  09/10/18 202 lb (91.6 kg)  05/29/18 205 lb (93 kg)  10/28/17 202 lb (91.6 kg)    BP 130/80   Pulse 76   Ht 5\' 10"  (1.778 m)   Wt 202 lb (91.6 kg)   BMI 28.98 kg/m   Assessment and Plan:  1. Acute hemorrhagic otitis externa of right ear New onset acute.  No pain associated patient had recent onset of blood per right ear.  Canal appeared to have coagulated blood distally with fresh bleeding proximal to the clot.  Tympanic membranes bilaterally look normal there is no pain with manipulation of the pinna.  Will initiate Floxin 5 drops to right ear daily after discussion with Dr. Tami Ribas. follow-up evaluation in 3 weeks with [facility].  And  is instructed to return to clinic if pain should occur. - Ambulatory referral to ENT

## 2018-11-12 ENCOUNTER — Encounter: Payer: Self-pay | Admitting: Family Medicine

## 2018-11-12 ENCOUNTER — Other Ambulatory Visit: Payer: Self-pay

## 2018-11-12 ENCOUNTER — Ambulatory Visit: Payer: Managed Care, Other (non HMO) | Admitting: Family Medicine

## 2018-11-12 VITALS — BP 120/56 | HR 80 | Ht 70.0 in | Wt 203.0 lb

## 2018-11-12 DIAGNOSIS — M461 Sacroiliitis, not elsewhere classified: Secondary | ICD-10-CM | POA: Diagnosis not present

## 2018-11-12 MED ORDER — MELOXICAM 15 MG PO TABS: 15.0000 mg | ORAL_TABLET | Freq: Every day | ORAL | 2 refills | Status: DC

## 2018-11-12 MED ORDER — PREDNISONE 10 MG PO TABS: 10.0000 mg | ORAL_TABLET | Freq: Every day | ORAL | 0 refills | Status: DC

## 2018-11-12 NOTE — Patient Instructions (Signed)
Sacroiliac Joint Injection    A sacroiliac (SI) joint injection is a procedure to inject a numbing medicine (anesthetic block)--and sometimes a strong anti-inflammatory medicine (steroid)--into the SI joint. The SI joint is the joint between two bones of the pelvis called the sacrum and the ilium. The sacrum is the bone at the base of the spine. The ilium is the large bone that forms the hip.  You may need this procedure if you have pain because of an inflamed or diseased SI joint. Various conditions can cause pain in the SI joint, including rheumatoid arthritis, gout, psoriatic arthritis, infection, or injury. SI joint pain is a common cause of low back pain. It may also cause pain in your buttock or leg.  SI joint injection may be done to:  Find out if an anesthetic block relieves pain. This can confirm that the SI joint is the cause of pain (diagnostic use).  Treat a painful SI joint with steroids, anesthetic medicine, or both (therapeutic use).  Tell a health care provider about:  Any allergies you have.  All medicines you are taking, including vitamins, herbs, eye drops, creams, and over-the-counter medicines.  Any problems you or family members have had with anesthetic medicines.  Any blood disorders you have.  Any surgeries you have had.  Any medical conditions you have.  Whether you are pregnant or may be pregnant.  What are the risks?  Generally, this is a safe procedure. However, problems may occur, including:  Infection.  Bleeding.  Nerve injury.  Temporary increase in pain.  Headache.  Failure of the procedure to relieve pain.  Bruising or soreness at the joint, in deep tissues, or at the injection site.  Allergic reactions to medicines or dyes.  Side effects from the steroid medicine. These may include facial flushing, increased appetite, diarrhea, and increased blood sugar.  What happens before the procedure?  You may have a physical exam.  You may have imaging tests, such as an X-ray, CT scan, or  MRI.  Ask your health care provider about:  Changing or stopping your regular medicines. This is especially important if you are taking diabetes medicines or blood thinners.  Taking medicines such as aspirin and ibuprofen. These medicines can thin your blood. Do not take these medicines unless your health care provider tells you to take them.  Taking over-the-counter medicines, vitamins, herbs, and supplements.  Plan to have someone take you home from the hospital or clinic.  What happens during the procedure?  To lower your risk of infection:  Your health care team will wash or sanitize their hands.  Your skin will be washed with a germ-killing (antiseptic) solution.  You may be given one or more of the following:  A medicine to help you relax (sedative).  A medicine to numb the area (local anesthetic). Your health care provider will inject a local anesthetic into the skin above your SI joint.  You will be placed in the proper position on a procedure table to give the health care team the best access to your SI joint.  An X-ray machine that produces moving X-ray images (fluoroscopy) will be placed above the procedure table.  A long, thin needle will be inserted through your skin and down to your SI joint.  The position of the needle will be checked with fluoroscopy imaging.  An X-ray dye (contrast media) will be injected to make sure the needle enters the joint space. You may be asked if you feel any pain.    medicine may also be injected.  The needle will be removed, and a bandage will be placed over the injection site. The procedure may vary among health care providers and hospitals. What happens after the procedure?  Your blood pressure, heart rate, breathing rate, and blood oxygen level will be monitored until the medicines you were given have worn off.  If dye was  used, you will be told to drink plenty of water to wash (flush) the dye out of your body.  You may be asked if you have pain relief from the injection.  You will likely be able to go home shortly after the procedure.  Your health care provider will give you instructions for taking care of yourself after the procedure. These may include instructions for doing physical therapy exercises.  Do not drive for 24 hours if you were given a sedative during the procedure. Summary  A sacroiliac (SI) joint injection is an injection of a numbing medicine (anesthetic block)-and sometimes a strong anti-inflammatory medicine (steroid)-into the SI joint.  You will be awake during the procedure, but the injection area will be made numb.  If you were given a medicine to help you relax (sedative during the procedure, do not drive for at least 24 hours. This information is not intended to replace advice given to you by your health care provider. Make sure you discuss any questions you have with your health care provider. Document Released: 03/03/2017 Document Revised: 03/03/2017 Document Reviewed: 03/03/2017 Elsevier Interactive Patient Education  2019 [place]. Sacroiliac Joint Dysfunction  Sacroiliac joint dysfunction is a condition that causes inflammation on one or both sides of the sacroiliac (SI) joint. The SI joint connects the lower part of the spine (sacrum) with the two upper portions of the pelvis (ilium). This condition causes deep aching or burning pain in the low back. In some cases, the pain may also spread into one or both buttocks, hips, or thighs. What are the causes? This condition may be caused by:  Pregnancy. During pregnancy, extra stress is put on the SI joints because the pelvis widens.  Injury, such as: ? Injuries from car accidents. ? Sports-related injuries. ? Work-related injuries.  Having one leg that is shorter than the other.  Conditions that affect the joints, such  as: ? Rheumatoid arthritis. ? Gout. ? Psoriatic arthritis. ? Joint infection (septic arthritis). Sometimes, the cause of SI joint dysfunction is not known. What are the signs or symptoms? Symptoms of this condition include:  Aching or burning pain in the lower back. The pain may also spread to other areas, such as: ? Buttocks. ? Groin. ? Thighs.  Muscle spasms in or around the painful areas.  Increased pain when standing, walking, running, stair climbing, bending, or lifting. How is this diagnosed? This condition is diagnosed with a physical exam and medical history. During the exam, the health care provider may move one or both of your legs to different positions to check for pain. Various tests may be done to confirm the diagnosis, including:  Imaging tests to look for other causes of pain. These may include: ? MRI. ? CT scan. ? Bone scan.  Diagnostic injection. A numbing medicine is injected into the SI joint using a needle. If your pain is temporarily improved or stopped after the injection, this can indicate that SI joint dysfunction is the problem. How is this treated? Treatment depends on the cause and severity of your condition. Treatment options may include:  Ice or heat applied  to the lower back area after an injury. This may help reduce pain and muscle spasms.  Medicines to relieve pain or inflammation or to relax the muscles.  Wearing a back brace (sacroiliac brace) to help support the joint while your back is healing.  Physical therapy to increase muscle strength around the joint and flexibility at the joint. This may also involve learning proper body positions and ways of moving to relieve stress on the joint.  Direct manipulation of the SI joint.  Injections of steroid medicine into the joint to reduce pain and swelling.  Radiofrequency ablation to burn away nerves that are carrying pain messages from the joint.  Use of a device that provides electrical  stimulation to help reduce pain at the joint.  Surgery to put in screws and plates that limit or prevent joint motion. This is rare. Follow these instructions at home: Medicines  Take over-the-counter and prescription medicines only as told by your health care provider.  Do not drive or use heavy machinery while taking prescription pain medicine.  If you are taking prescription pain medicine, take actions to prevent or treat constipation. Your health care provider may recommend that you: ? Drink enough fluid to keep your urine pale yellow. ? Eat foods that are high in fiber, such as fresh fruits and vegetables, whole grains, and beans. ? Limit foods that are high in fat and processed sugars, such as fried or sweet foods. ? Take an over-the-counter or prescription medicine for constipation. If you have a brace:  Wear the brace as told by your health care provider. Remove it only as told by your health care provider.  Keep the brace clean.  If the brace is not waterproof: ? Do not let it get wet. ? Cover it with a watertight covering when you take a bath or a shower. Managing pain, stiffness, and swelling      Icing can help with pain and swelling. Heat may help with muscle tension or spasms. Ask your health care provider if you should use ice or heat.  If directed, put ice on the affected area: ? If you have a removable brace, remove it as told by your health care provider. ? Put ice in a plastic bag. ? Place a towel between your skin and the bag. ? Leave the ice on for 20 minutes, 2-3 times a day.  If directed, apply heat to the affected area. Use the heat source that your health care provider recommends, such as a moist heat pack or a heating pad. ? Place a towel between your skin and the heat source. ? Leave the heat on for 20-30 minutes. ? Remove the heat if your skin turns bright red. This is especially important if you are unable to feel pain, heat, or cold. You may have  a greater risk of getting burned. General instructions  Rest as needed. Ask your health care provider what activities are safe for you.  Return to your normal activities as told by your health care provider.  Exercise as directed by your health care provider or physical therapist.  Do not use any products that contain nicotine or tobacco, such as cigarettes and e-cigarettes. These can delay bone healing. If you need help quitting, ask your health care provider.  Keep all follow-up visits as told by your health care provider. This is important. Contact a health care provider if:  Your pain is not controlled with medicine.  You have a fever.  Your pain  is getting worse. Get help right away if:  You have weakness, numbness, or tingling in your legs or feet.  You lose control of your bladder or bowel. Summary  Sacroiliac joint dysfunction is a condition that causes inflammation on one or both sides of the sacroiliac (SI) joint.  This condition causes deep aching or burning pain in the low back. In some cases, the pain may also spread into one or both buttocks, hips, or thighs.  Treatment depends on the cause and severity of your condition. It may include medicines to reduce pain and swelling or to relax muscles. This information is not intended to replace advice given to you by your health care provider. Make sure you discuss any questions you have with your health care provider. Document Released: 08/23/2008 Document Revised: 07/07/2017 Document Reviewed: 07/07/2017 Elsevier Interactive Patient Education  2019 [employer].

## 2018-11-12 NOTE — Progress Notes (Signed)
Date:  11/12/2018   Name:  Hayley Hale   DOB:  [birth year]   MRN:  [id number]   Chief Complaint: Hip Pain (R) hip pain x few months)  Hip Pain   The incident occurred more than 1 week ago. There was no injury mechanism. The pain is present in the right hip (right si area). The quality of the pain is described as aching and stabbing. The pain is at a severity of 8/10. The pain is moderate. The pain has been intermittent since onset. Pertinent negatives include no inability to bear weight, loss of motion, loss of sensation, muscle weakness, numbness or tingling. She reports no foreign bodies present. Exacerbated by: "if I move the wrong way" She has tried NSAIDs (aleve) for the symptoms. The treatment provided moderate relief.    Review of Systems  Constitutional: Negative for chills and fever.  HENT: Negative for drooling, ear discharge, ear pain and sore throat.   Respiratory: Negative for cough, shortness of breath and wheezing.   Cardiovascular: Negative for chest pain, palpitations and leg swelling.  Gastrointestinal: Negative for abdominal pain, blood in stool, constipation, diarrhea and nausea.  Endocrine: Negative for polydipsia.  Genitourinary: Negative for dysuria, frequency, hematuria and urgency.  Musculoskeletal: Negative for back pain, myalgias and neck pain.  Skin: Negative for rash.  Allergic/Immunologic: Negative for environmental allergies.  Neurological: Negative for dizziness, tingling, numbness and headaches.  Hematological: Does not bruise/bleed easily.  Psychiatric/Behavioral: Negative for suicidal ideas. The patient is not nervous/anxious.     Patient Active Problem List   Diagnosis Date Noted  . Increased risk of breast cancer 05/31/2018  . Radial neuropathy, right 10/28/2017  . Ulnar neuritis, right 10/28/2017  . Family history of breast cancer 05/12/2017  . PCOS (polycystic ovarian syndrome) 05/12/2017  . SVT (supraventricular tachycardia) ([place])  05/12/2017  . Rosacea   . Hypothyroidism     No Known Allergies  Past Surgical History:  Procedure Laterality Date  . CARDIAC ELECTROPHYSIOLOGY [place] AND ABLATION  1999  . CHOLECYSTECTOMY  06/2001  . COLONOSCOPY WITH PROPOFOL N/A 08/05/2017   Procedure: COLONOSCOPY WITH PROPOFOL;  Surgeon: Toledo, Benay Pike, MD;  Location: [facility] ENDOSCOPY;  Service: Gastroenterology;  Laterality: N/A;  . DILATION AND CURETTAGE OF UTERUS  multiple in 1997-98  . HYSTEROSCOPY     x 1  . TONSILLECTOMY AND ADENOIDECTOMY      Social History   Tobacco Use  . Smoking status: Never Smoker  . Smokeless tobacco: Never Used  Substance Use Topics  . Alcohol use: No  . Drug use: No     Medication list has been reviewed and updated.  Current Meds  Medication Sig  . B Complex Vitamins (VITAMIN-B COMPLEX) TABS Take 1 tablet by mouth daily.  . cetirizine (ZYRTEC) 10 MG tablet Take 10 mg by mouth daily.  . Cholecalciferol (VITAMIN D3) 5000 units CAPS Take 1 capsule by mouth daily.  . flecainide (TAMBOCOR) 50 MG tablet Take 50 mg by mouth 3 (three) times daily. Dr Burt Knack  . Ivermectin (SOOLANTRA) 1 % CREA Apply 1 application topically daily. Dr Phillip Heal  . levothyroxine (SYNTHROID, LEVOTHROID) 175 MCG tablet Take 175 mcg by mouth daily before breakfast. Dr Leonides Schanz  . metFORMIN (GLUCOPHAGE) 1000 MG tablet Take 1,000 mg by mouth 2 (two) times daily with a meal. [facility]  . metoprolol tartrate (LOPRESSOR) 25 MG tablet Take 12.5 mg by mouth 2 (two) times daily. cooper  . spironolactone (ALDACTONE) 50 MG tablet Take 50 mg  by mouth daily. Dr Phillip Heal  . tazarotene (AVAGE) 0.1 % cream derm    PHQ 2/9 Scores 11/12/2018 10/28/2017 02/19/2017  PHQ - 2 Score 0 0 0  PHQ- 9 Score 0 0 0    BP Readings from Last 3 Encounters:  11/12/18 (!) 120/56  09/10/18 130/80  05/29/18 110/70    Physical Exam Vitals signs and nursing note reviewed.  Constitutional:      General: She is not in acute distress.     Appearance: She is not diaphoretic.  HENT:     Head: Normocephalic and atraumatic.     Right Ear: External ear normal.     Left Ear: External ear normal.     Nose: Nose normal.  Eyes:     General:        Right eye: No discharge.        Left eye: No discharge.     Conjunctiva/sclera: Conjunctivae normal.     Pupils: Pupils are equal, round, and reactive to light.  Neck:     Musculoskeletal: Normal range of motion and neck supple.     Thyroid: No thyromegaly.     Vascular: No JVD.  Cardiovascular:     Rate and Rhythm: Normal rate and regular rhythm.     Heart sounds: Normal heart sounds. No murmur. No friction rub. No gallop.   Pulmonary:     Effort: Pulmonary effort is normal.     Breath sounds: Normal breath sounds.  Abdominal:     General: Bowel sounds are normal.     Palpations: Abdomen is soft. There is no mass.     Tenderness: There is no abdominal tenderness. There is no guarding.  Musculoskeletal: Normal range of motion.     Right hip: She exhibits tenderness. She exhibits normal range of motion, normal strength, no bony tenderness, no swelling, no crepitus and no deformity.       Legs:     Comments: Tender along right SI joint  Lymphadenopathy:     Cervical: No cervical adenopathy.  Skin:    General: Skin is warm and dry.  Neurological:     Mental Status: She is alert.     Deep Tendon Reflexes: Reflexes are normal and symmetric.     Wt Readings from Last 3 Encounters:  11/12/18 203 lb (92.1 kg)  09/10/18 202 lb (91.6 kg)  05/29/18 205 lb (93 kg)    BP (!) 120/56   Pulse 80   Ht 5\' 10"  (1.778 m)   Wt 203 lb (92.1 kg)   BMI 29.13 kg/m   Assessment and Plan:  1. Sacroiliitis (Flushing) More of a sacroiliac dysfunction with irritation of the at the SI joint.  There is tenderness noted will initiate prednisone 10 mg once a day and start meloxicam 15 mg once a day.  Reviewed previous x-ray.  - predniSONE (DELTASONE) 10 MG tablet; Take 1 tablet (10 mg total) by  mouth daily with breakfast.  Dispense: 30 tablet; Refill: 0 - meloxicam (MOBIC) 15 MG tablet; Take 1 tablet (15 mg total) by mouth daily.  Dispense: 30 tablet; Refill: 2

## 2019-05-04 ENCOUNTER — Other Ambulatory Visit: Payer: Self-pay | Admitting: Certified Nurse Midwife

## 2019-05-04 DIAGNOSIS — Z1231 Encounter for screening mammogram for malignant neoplasm of breast: Secondary | ICD-10-CM

## 2019-05-30 NOTE — Progress Notes (Signed)
Gynecology Annual Exam  PCP: Juline Patch, MD  Chief Complaint:  Chief Complaint  Patient presents with  . Gynecologic Exam    has had three YI since July - the last of which was last week, wants to be ck'd to be sure if it's gone.    History of Present Illness:Hayley Hale is a 53 year old Caucasian/White female , G 5 P 2 0 3 2 , who presents for her annual exam . She is having problems with recurrent vaginal itching and burning. Has treated herself for a "yeast infection" with Vagistat 3 at least three times over the last year. She has not changed any soaps or detergents. Has been using condoms for contraception and has noticed the symptoms appear after intercourse. She has been amenorrheic for 11 months and her LMP was 07/09/2018   She is having hot flashes.  The patient's past medical history is notable for a history of hypothyroidism, PCOS, rosacea, and PSVT.  Since her last annual GYN exam dated 05/30/2018, she has had no significant changes in her health history. She is sexually active. She is currently using condoms for contraception.  Her most recent pap smear was obtained 05/30/2018 and was NIL. Her most recent mammogram obtained on 06/15/2018 was normal. A screening breast MRI 06/29/18 was also negative. There is a positive history of breast cancer in her maternal grandmother and half sister. Genetic testing has been done. The half sister tested negative for BRCA 1 and negative for BRCA 2. The patient had negative MYRISK testing last year, but her risk of breast cancer was between 18.9% and 25.4%. There is no family history of ovarian cancer. She had a colonoscopy 08/05/2017 and she reports that it was normal. She is due another in 5 years (family hx of polyps) The patient does do occ self breast exams.  The patient does not smoke.  The patient does not drink alcohol.  The patient does not use illegal drugs.  The patient exercises most days of the week by walking 3  1/2 miles/day The patient does get adequate calcium in her diet.  She had a recent cholesterol screen 2016 that was normal   The patient denies current symptoms of depression.    Review of Systems: Review of Systems  Constitutional: Negative for chills, fever and weight loss.  HENT: Negative for congestion, sinus pain and sore throat.   Eyes: Negative for blurred vision, pain and redness.  Respiratory: Negative for hemoptysis, shortness of breath and wheezing.   Cardiovascular: Negative for chest pain, palpitations and leg swelling.  Gastrointestinal: Negative for abdominal pain, blood in stool, diarrhea, heartburn, nausea and vomiting.  Genitourinary: Negative for dysuria, frequency, hematuria and urgency.       Positive for amenorrhea  Musculoskeletal: Negative for back pain, joint pain and myalgias.  Skin: Negative for itching and rash.  Neurological: Negative for dizziness, tingling and headaches.  Endo/Heme/Allergies: Positive for environmental allergies. Negative for polydipsia. Does not bruise/bleed easily.       Positive for hot flashes   Psychiatric/Behavioral: Negative for depression. The patient is not nervous/anxious and does not have insomnia.     Past Medical History:  Past Medical History:  Diagnosis Date  . BRCA negative 05/2017   MyRisk neg  . Family history of breast cancer 05/2017   riskscore=18.9%/IBIS=25.4%  . Hypothyroidism   . PCOS (polycystic ovarian syndrome)   . Rosacea   . SVT (supraventricular tachycardia) ([place])  Past Surgical History:  Past Surgical History:  Procedure Laterality Date  . CARDIAC ELECTROPHYSIOLOGY [place] AND ABLATION  1999  . CHOLECYSTECTOMY  06/2001  . COLONOSCOPY WITH PROPOFOL N/A 08/05/2017   Procedure: COLONOSCOPY WITH PROPOFOL;  Surgeon: Toledo, Benay Pike, MD;  Location: [facility] ENDOSCOPY;  Service: Gastroenterology;  Laterality: N/A;  . DILATION AND CURETTAGE OF UTERUS  multiple in 1997-98  . HYSTEROSCOPY     x 1  .  TONSILLECTOMY AND ADENOIDECTOMY      Family History:  Family History  Problem Relation Age of Onset  . Breast cancer Sister 55       pat half sister  . Breast cancer Maternal Grandmother 32  . Hypertension Mother   . Hyperlipidemia Mother   . Hypertension Father   . Hyperlipidemia Father   . Pancreatic cancer Paternal Grandmother 47  . Colon cancer Other     Social History:  Social History   Socioeconomic History  . Marital status: Married    Spouse name: Ruthann Cancer  . Number of children: 2  . Years of education: Not on file  . Highest education level: Not on file  Occupational History  . Occupation: Homemaker  Tobacco Use  . Smoking status: Never Smoker  . Smokeless tobacco: Never Used  Substance and Sexual Activity  . Alcohol use: No  . Drug use: No  . Sexual activity: Yes    Partners: Male    Birth control/protection: Condom  Other Topics Concern  . Not on file  Social History Narrative  . Not on file   Social Determinants of Health   Financial Resource Strain:   . Difficulty of Paying Living Expenses: Not on file  Food Insecurity:   . Worried About Charity fundraiser in the Last Year: Not on file  . Ran Out of Food in the Last Year: Not on file  Transportation Needs:   . Lack of Transportation (Medical): Not on file  . Lack of Transportation (Non-Medical): Not on file  Physical Activity:   . Days of Exercise per Week: Not on file  . Minutes of Exercise per Session: Not on file  Stress:   . Feeling of Stress : Not on file  Social Connections:   . Frequency of Communication with Friends and Family: Not on file  . Frequency of Social Gatherings with Friends and Family: Not on file  . Attends Religious Services: Not on file  . Active Member of Clubs or Organizations: Not on file  . Attends Archivist Meetings: Not on file  . Marital Status: Not on file  Intimate Partner Violence:   . Fear of Current or Ex-Partner: Not on file  . Emotionally  Abused: Not on file  . Physically Abused: Not on file  . Sexually Abused: Not on file    Allergies:  No Known Allergies  Medications:  Current Outpatient Medications:  .  B Complex Vitamins (VITAMIN-B COMPLEX) TABS, Take 1 tablet by mouth daily., Disp: , Rfl:  .  cetirizine (ZYRTEC) 10 MG tablet, Take 10 mg by mouth daily., Disp: , Rfl:  .  Cholecalciferol (VITAMIN D3) 5000 units CAPS, Take 1 capsule by mouth daily., Disp: , Rfl:  .  flecainide (TAMBOCOR) 50 MG tablet, Take 50 mg by mouth 3 (three) times daily. Dr Burt Knack, Disp: , Rfl:  .  Ivermectin (SOOLANTRA) 1 % CREA, Apply 1 application topically daily. Dr Phillip Heal, Disp: , Rfl:  .  levothyroxine (SYNTHROID) 200 MCG tablet, Take 1 tablet by  mouth daily., Disp: , Rfl:  .  metFORMIN (GLUCOPHAGE) 1000 MG tablet, Take 1,000 mg by mouth 2 (two) times daily with a meal. [place] endo, Disp: , Rfl:  .  metoprolol tartrate (LOPRESSOR) 25 MG tablet, Take 12.5 mg by mouth 2 (two) times daily. cooper, Disp: , Rfl:  .  spironolactone (ALDACTONE) 100 MG tablet, Take 1 tablet by mouth daily., Disp: , Rfl:  .  tazarotene (AVAGE) 0.1 % cream, derm, Disp: , Rfl: 6 .  triamcinolone ointment (KENALOG) 0.1 %, Apply 1 application topically 2 (two) times daily., Disp: 30 g, Rfl: 0   Physical Exam Vitals: BP 110/70   Pulse 71   Ht '5\' 10"'$  (1.778 m)   Wt 207 lb (93.9 kg)   BMI 29.70 kg/m  General: WF in NAD HEENT: normocephalic, anicteric Neck: no thyroid enlargement, no palpable nodules, no cervical lymphadenopathy  Pulmonary: No increased work of breathing, CTAB Cardiovascular: RRR, without murmur  Breast: Breast symmetrical, no tenderness, no palpable nodules or masses, no skin or nipple retraction present.   No axillary, infraclavicular or supraclavicular lymphadenopathy. Abdomen: Soft, non-tender, non-distended.  Umbilicus without lesions.  No hepatomegaly or masses palpable. No evidence of hernia. Genitourinary:  External: Normal external female  genitalia.  Normal urethral meatus, normal Bartholin's and Skene's glands.    Vagina: Normal vaginal mucosa, no evidence of prolapse, inflammation at introitus    Cervix: Grossly normal in appearance, no bleeding, non-tender  Uterus: Anteverted, normal size, shape, and consistency, mobile, and non-tender  Adnexa: No adnexal masses, non-tender  Rectal: deferred  Lymphatic: no evidence of inguinal lymphadenopathy Extremities: no edema, erythema, or tenderness Neurologic: Grossly intact Psychiatric: mood appropriate, affect full  Wet prep: negative for hyphae, Trich, clue cells   Assessment: 53 y.o. annual gyn exam Amenorrhea/ vasomotor symptoms-perimenopausal Family history of breast cancer with increased lifetime risk of breast cancer Inflammation of introitus with negative wet prep- will treat clinically for yeast with Diflucan and for a dermatitis with Kenalog ointment Plan:   1) Breast cancer screening - recommend monthly self breast exam and annual screening mammograms. Her next mammogram was scheduled for 06/21/2019. Would like to schedule breast MRI this year. Will wait to get results from mammogram to schedule MRI at GI ([id number] for self pay).    2) Colon cancer screening: up to date  3) Cervical cancer screening - Pap was done.   4) Contraception - needs to continue use of condoms until no menses x 1 year. If vulvovaginal burning continues after she stops using condoms, return for further evaluation and possible biopsy  5) Routine healthcare maintenance including cholesterol and diabetes screening managed by endocrinologist . Discussed calcium and vitamin D3 requirements.  6) RTO 1 year and prn  Dalia Heading, CNM

## 2019-05-31 ENCOUNTER — Encounter: Payer: Self-pay | Admitting: Certified Nurse Midwife

## 2019-05-31 ENCOUNTER — Other Ambulatory Visit: Payer: Self-pay

## 2019-05-31 ENCOUNTER — Other Ambulatory Visit (HOSPITAL_COMMUNITY)
Admission: RE | Admit: 2019-05-31 | Discharge: 2019-05-31 | Disposition: A | Discharge status: Home / Self Care | Payer: Managed Care, Other (non HMO) | Source: Ambulatory Visit | Attending: Certified Nurse Midwife | Admitting: Certified Nurse Midwife

## 2019-05-31 ENCOUNTER — Ambulatory Visit (INDEPENDENT_AMBULATORY_CARE_PROVIDER_SITE_OTHER): Payer: Managed Care, Other (non HMO) | Admitting: Certified Nurse Midwife

## 2019-05-31 VITALS — BP 110/70 | HR 71 | Ht 70.0 in | Wt 207.0 lb

## 2019-05-31 DIAGNOSIS — Z124 Encounter for screening for malignant neoplasm of cervix: Secondary | ICD-10-CM

## 2019-05-31 DIAGNOSIS — N9489 Other specified conditions associated with female genital organs and menstrual cycle: Secondary | ICD-10-CM

## 2019-05-31 DIAGNOSIS — Z803 Family history of malignant neoplasm of breast: Secondary | ICD-10-CM

## 2019-05-31 DIAGNOSIS — N951 Menopausal and female climacteric states: Secondary | ICD-10-CM

## 2019-05-31 DIAGNOSIS — Z01419 Encounter for gynecological examination (general) (routine) without abnormal findings: Secondary | ICD-10-CM

## 2019-05-31 DIAGNOSIS — N912 Amenorrhea, unspecified: Secondary | ICD-10-CM

## 2019-05-31 DIAGNOSIS — Z9189 Other specified personal risk factors, not elsewhere classified: Secondary | ICD-10-CM

## 2019-05-31 DIAGNOSIS — R102 Pelvic and perineal pain: Secondary | ICD-10-CM

## 2019-05-31 MED ORDER — FLUCONAZOLE 150 MG PO TABS: 150.0000 mg | ORAL_TABLET | Freq: Once | ORAL | 0 refills | Status: AC

## 2019-05-31 MED ORDER — TRIAMCINOLONE ACETONIDE 0.1 % EX OINT: 1.0000 "application " | TOPICAL_OINTMENT | Freq: Two times a day (BID) | CUTANEOUS | 0 refills | Status: DC

## 2019-06-02 LAB — CYTOLOGY - PAP
Comment: NEGATIVE
Diagnosis: NEGATIVE
High risk HPV: NEGATIVE

## 2019-06-06 LAB — POCT WET PREP (WET MOUNT): Trichomonas Wet Prep HPF POC: ABSENT

## 2019-06-08 ENCOUNTER — Other Ambulatory Visit: Payer: Self-pay

## 2019-06-11 DIAGNOSIS — C801 Malignant (primary) neoplasm, unspecified: Secondary | ICD-10-CM

## 2019-06-11 HISTORY — DX: Malignant (primary) neoplasm, unspecified: C80.1

## 2019-06-21 ENCOUNTER — Other Ambulatory Visit: Payer: Self-pay

## 2019-06-21 ENCOUNTER — Ambulatory Visit
Admission: RE | Admit: 2019-06-21 | Discharge: 2019-06-21 | Disposition: A | Discharge status: Home / Self Care | Payer: Managed Care, Other (non HMO) | Source: Ambulatory Visit | Attending: Certified Nurse Midwife | Admitting: Certified Nurse Midwife

## 2019-06-21 DIAGNOSIS — Z1231 Encounter for screening mammogram for malignant neoplasm of breast: Secondary | ICD-10-CM

## 2019-07-09 ENCOUNTER — Other Ambulatory Visit: Payer: Self-pay

## 2019-07-09 ENCOUNTER — Ambulatory Visit
Admission: RE | Admit: 2019-07-09 | Discharge: 2019-07-09 | Disposition: A | Discharge status: Home / Self Care | Payer: No Typology Code available for payment source | Source: Ambulatory Visit | Attending: Certified Nurse Midwife | Admitting: Certified Nurse Midwife

## 2019-07-09 DIAGNOSIS — Z9189 Other specified personal risk factors, not elsewhere classified: Secondary | ICD-10-CM

## 2019-07-09 DIAGNOSIS — Z803 Family history of malignant neoplasm of breast: Secondary | ICD-10-CM

## 2019-07-09 MED ORDER — GADOBUTROL 1 MMOL/ML IV SOLN
10.0000 mL | Freq: Once | INTRAVENOUS | Status: AC | PRN
  Administered 2019-07-09: 10 mL via INTRAVENOUS

## 2019-09-16 ENCOUNTER — Ambulatory Visit: Payer: Self-pay | Attending: Internal Medicine

## 2019-09-16 DIAGNOSIS — Z23 Encounter for immunization: Secondary | ICD-10-CM

## 2019-09-16 NOTE — Progress Notes (Signed)
   Covid-19 Vaccination Clinic  Name:  Adaline Sego    MRN: [id number] DOB: August 14, 1965  09/16/2019  Ms. Delis was observed post Covid-19 immunization for 15 minutes without incident. She was provided with Vaccine Information Sheet and instruction to access the V-Safe system.   Ms. Shiley was instructed to call 911 with any severe reactions post vaccine: Marland Kitchen Difficulty breathing  . Swelling of face and throat  . A fast heartbeat  . A bad rash all over body  . Dizziness and weakness   Immunizations Administered    Name Date Dose VIS Date Route   Pfizer COVID-19 Vaccine 09/16/2019  8:57 AM 0.3 mL 05/21/2019 Intramuscular   Manufacturer: [place]   Lot: [id number]   [place]: [id number]

## 2019-10-12 ENCOUNTER — Ambulatory Visit: Payer: Self-pay | Attending: Internal Medicine

## 2019-10-12 DIAGNOSIS — Z23 Encounter for immunization: Secondary | ICD-10-CM

## 2019-10-12 NOTE — Progress Notes (Signed)
   Covid-19 Vaccination Clinic  Name:  Kristina Hastings    MRN: [id number] DOB: 1965-06-28  10/12/2019  Ms. Trzaska was observed post Covid-19 immunization for 15 minutes without incident. She was provided with Vaccine Information Sheet and instruction to access the V-Safe system.   Ms. Carella was instructed to call 911 with any severe reactions post vaccine: Marland Kitchen Difficulty breathing  . Swelling of face and throat  . A fast heartbeat  . A bad rash all over body  . Dizziness and weakness   Immunizations Administered    Name Date Dose VIS Date Route   Pfizer COVID-19 Vaccine 10/12/2019  9:13 AM 0.3 mL 08/04/2018 Intramuscular   Manufacturer: [place]   Lot: [id number]   [place]: [id number]

## 2020-04-28 IMAGING — MG DIGITAL SCREENING BILATERAL MAMMOGRAM WITH TOMO AND CAD
6 of 10 series · 6 of 30 positions shown · non-contrast
Comparison: Previous exam(s).

CLINICAL DATA: Screening.

EXAM:
DIGITAL SCREENING BILATERAL MAMMOGRAM WITH TOMO AND CAD

[R CC synth-2D (1 of 2)]
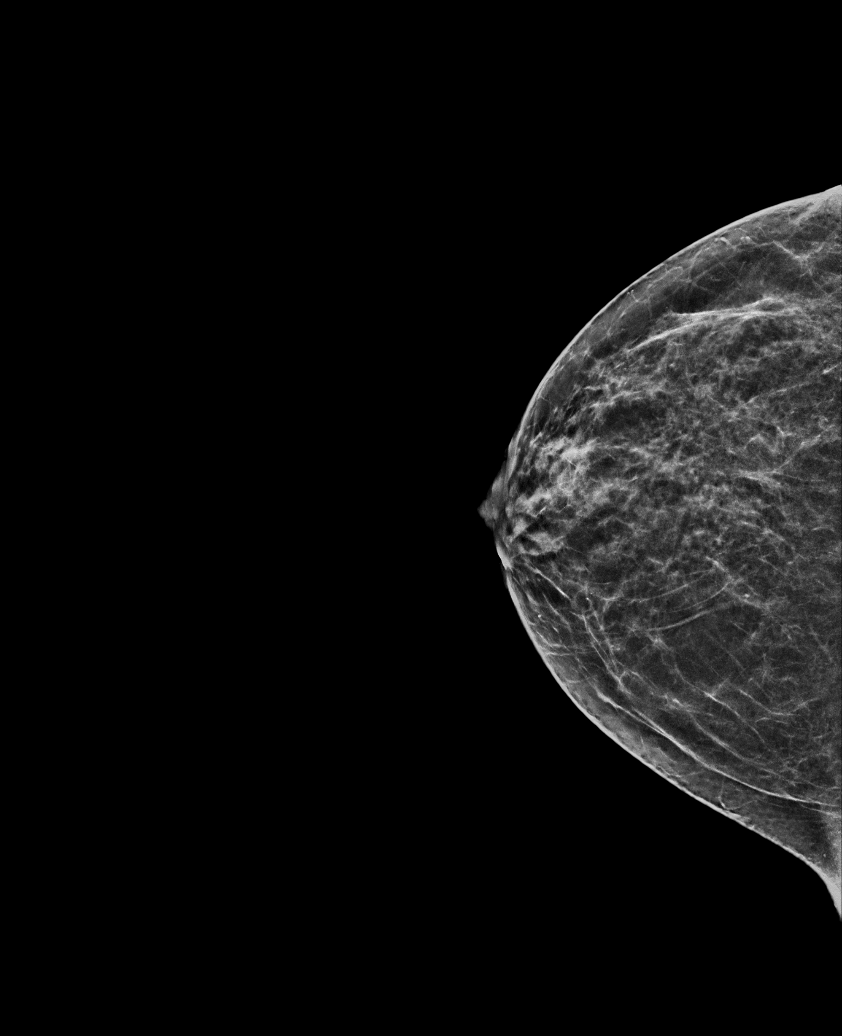

[R CC synth-2D (2 of 2)]
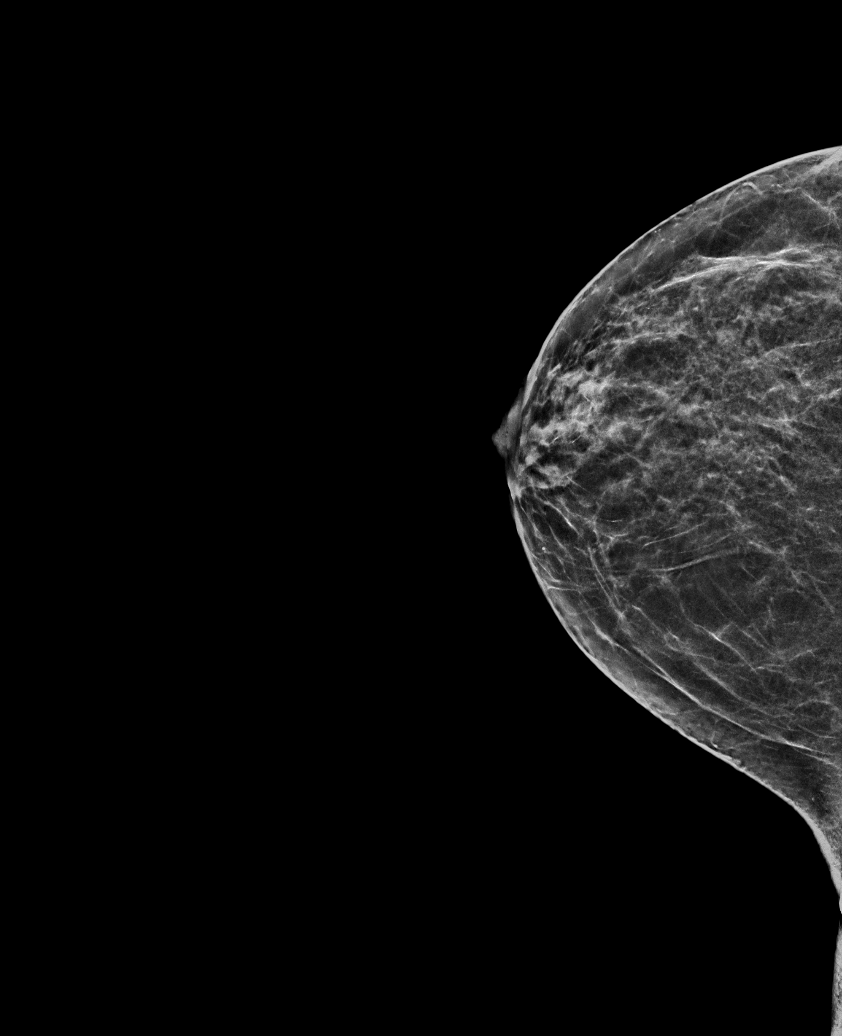

[L CC synth-2D]
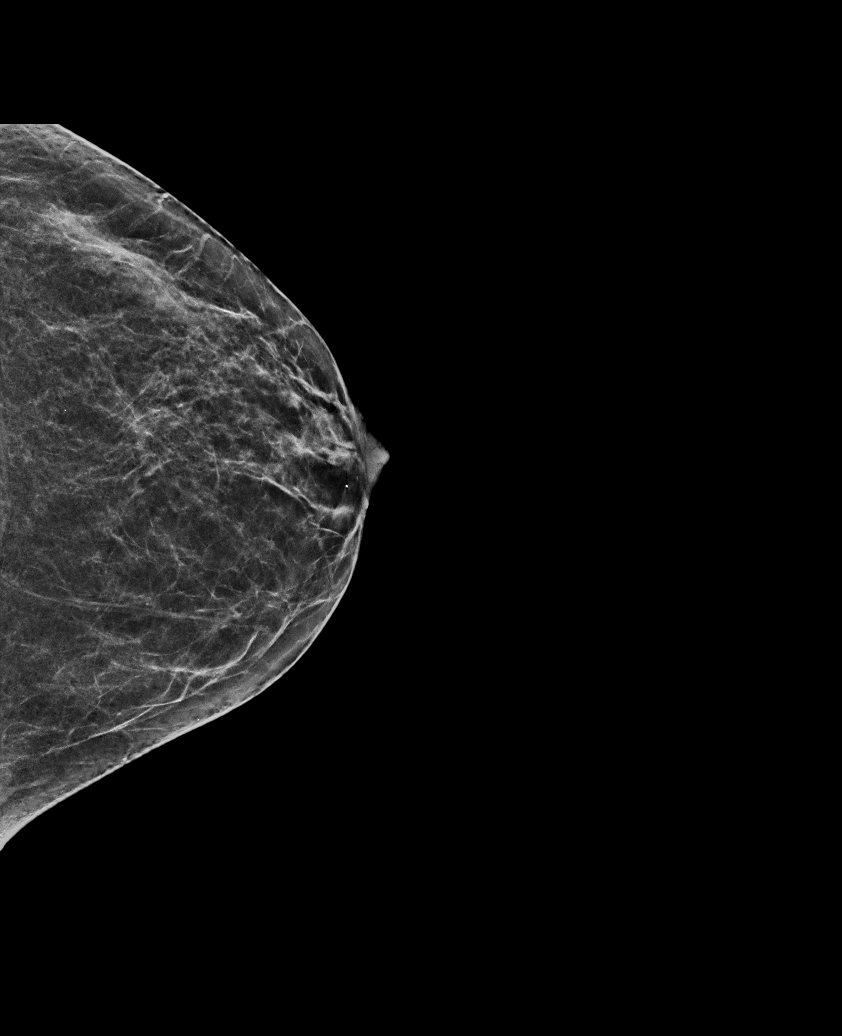

[R MLO synth-2D]
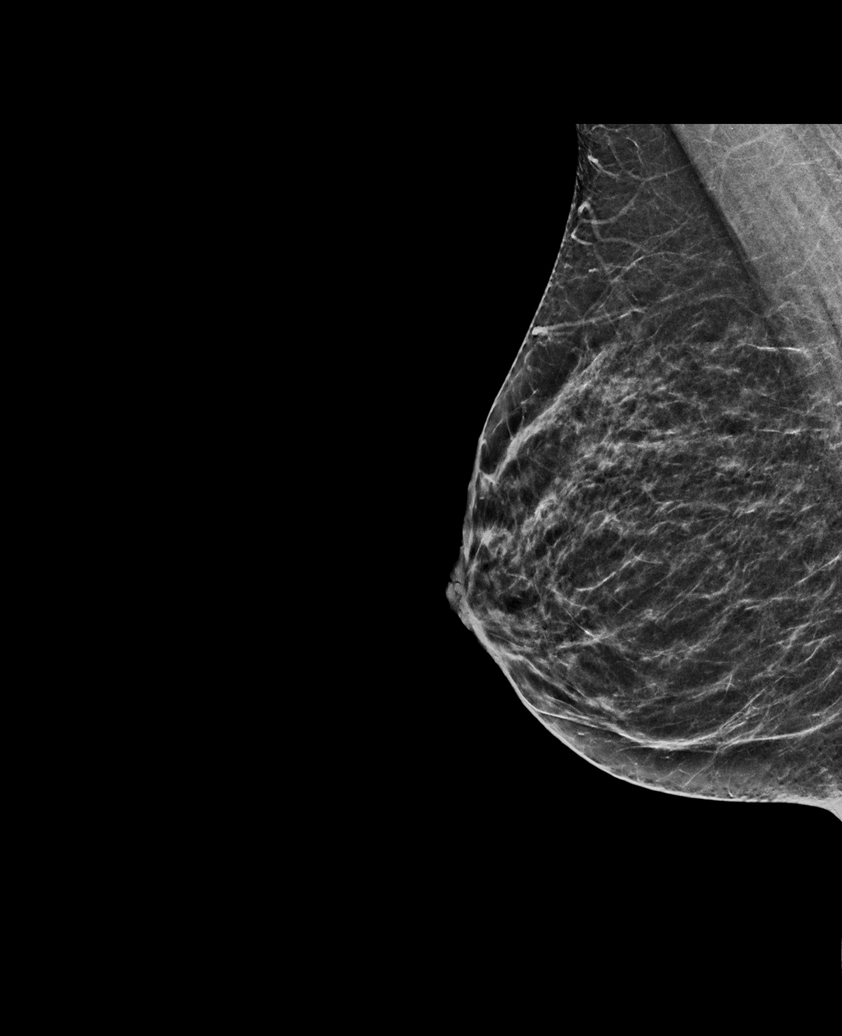

[L MLO synth-2D]
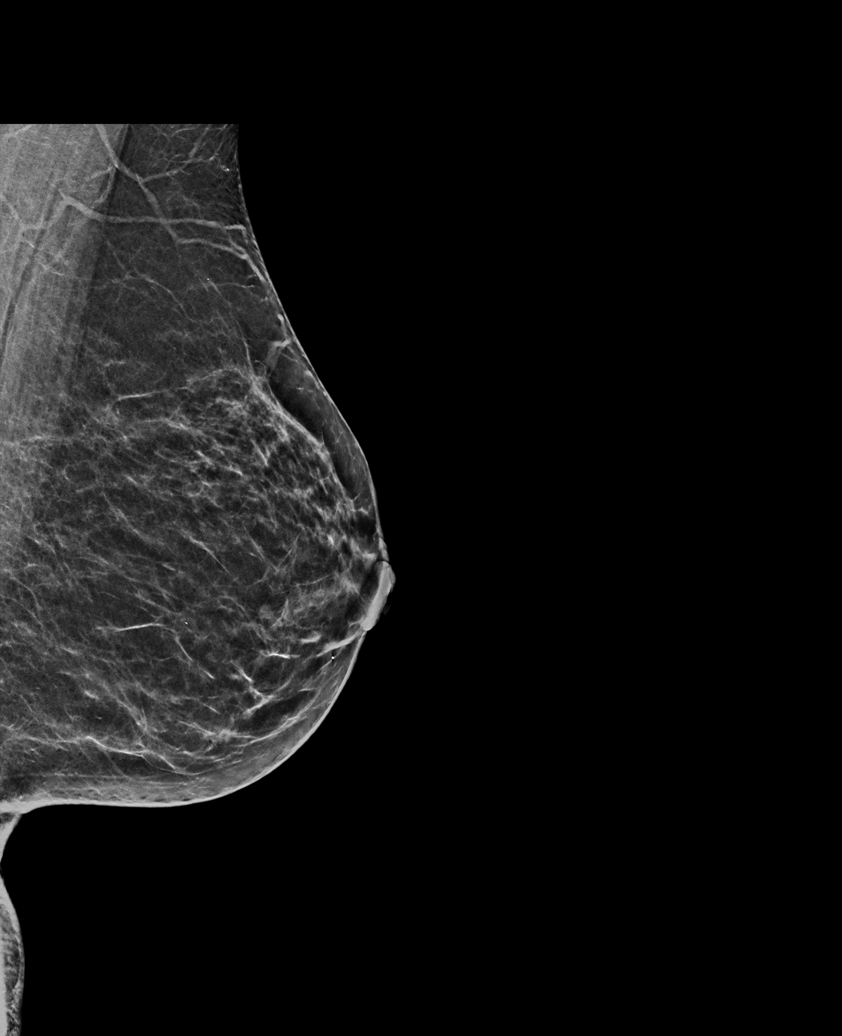

[L MLO tomo · tomo slice 29/56.0]
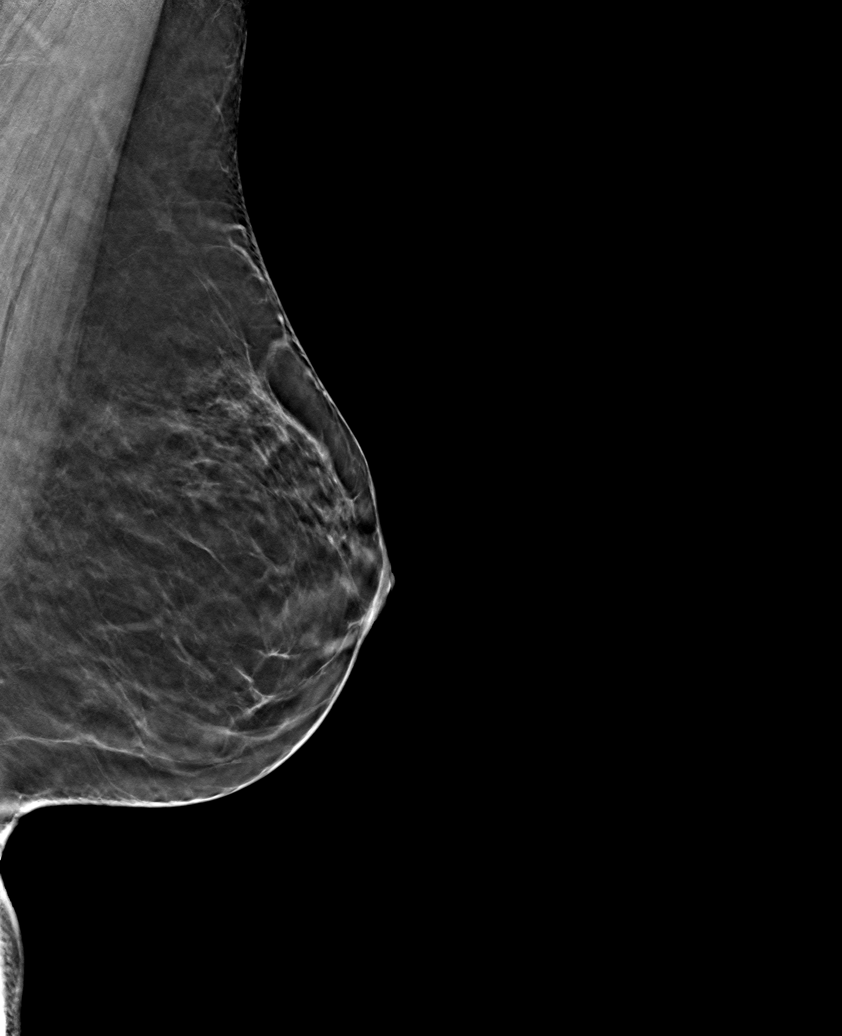

[6 of 30 positions shown; findings below may reference images not displayed]

ACR Breast Density Category b: There are scattered areas of
fibroglandular density.
FINDINGS: There are no findings suspicious for malignancy. Images were
processed with CAD.
IMPRESSION: No mammographic evidence of malignancy. A result letter of this
screening mammogram will be mailed directly to the patient.

RECOMMENDATION:
Screening mammogram in one year. (Code:[id number])

BI-RADS CATEGORY  1: Negative.

## 2020-05-31 ENCOUNTER — Encounter: Payer: Self-pay | Admitting: Obstetrics and Gynecology

## 2020-05-31 ENCOUNTER — Other Ambulatory Visit (HOSPITAL_COMMUNITY)
Admission: RE | Admit: 2020-05-31 | Discharge: 2020-05-31 | Disposition: A | Discharge status: Home / Self Care | Payer: Managed Care, Other (non HMO) | Source: Ambulatory Visit | Attending: Obstetrics and Gynecology | Admitting: Obstetrics and Gynecology

## 2020-05-31 ENCOUNTER — Other Ambulatory Visit: Payer: Self-pay

## 2020-05-31 ENCOUNTER — Ambulatory Visit (INDEPENDENT_AMBULATORY_CARE_PROVIDER_SITE_OTHER): Payer: Managed Care, Other (non HMO) | Admitting: Obstetrics and Gynecology

## 2020-05-31 VITALS — BP 118/72 | Ht 70.0 in | Wt 205.4 lb

## 2020-05-31 DIAGNOSIS — Z01419 Encounter for gynecological examination (general) (routine) without abnormal findings: Secondary | ICD-10-CM | POA: Diagnosis not present

## 2020-05-31 DIAGNOSIS — Z124 Encounter for screening for malignant neoplasm of cervix: Secondary | ICD-10-CM | POA: Insufficient documentation

## 2020-05-31 DIAGNOSIS — Z0000 Encounter for general adult medical examination without abnormal findings: Secondary | ICD-10-CM | POA: Diagnosis not present

## 2020-05-31 DIAGNOSIS — Z803 Family history of malignant neoplasm of breast: Secondary | ICD-10-CM

## 2020-05-31 DIAGNOSIS — Z1211 Encounter for screening for malignant neoplasm of colon: Secondary | ICD-10-CM

## 2020-05-31 DIAGNOSIS — Z1231 Encounter for screening mammogram for malignant neoplasm of breast: Secondary | ICD-10-CM

## 2020-05-31 DIAGNOSIS — Z9189 Other specified personal risk factors, not elsewhere classified: Secondary | ICD-10-CM

## 2020-05-31 NOTE — Progress Notes (Signed)
Gynecology Annual Exam  PCP: Juline Patch, MD  Chief Complaint:  Chief Complaint  Patient presents with  . Gynecologic Exam    Annual exam    History of Present Illness: Patient is a 54 y.o. [id number] presents for annual exam. The patient has no complaints today.   LMP: No LMP recorded. Patient is perimenopausal.  She denies postmenopausal bleeding or spotting  The patient is sexually active. She reports vaginal dryness and hemorrhoid pain.  Postcoital Bleeding: no   The patient does not perform self breast exams.  There is no notable family history of breast or ovarian cancer in her family.  The patient has regular exercise: yes, treadmill 7 times a week  The patient denies current symptoms of depression.   Review of Systems: Review of Systems  Constitutional: Negative for chills, fever, malaise/fatigue and weight loss.  HENT: Negative for congestion, hearing loss and sinus pain.   Eyes: Negative for blurred vision and double vision.  Respiratory: Negative for cough, sputum production, shortness of breath and wheezing.   Cardiovascular: Negative for chest pain, palpitations, orthopnea and leg swelling.  Gastrointestinal: Negative for abdominal pain, constipation, diarrhea, nausea and vomiting.  Genitourinary: Negative for dysuria, flank pain, frequency, hematuria and urgency.  Musculoskeletal: Negative for back pain, falls and joint pain.  Skin: Negative for itching and rash.  Neurological: Negative for dizziness and headaches.  Psychiatric/Behavioral: Negative for depression, substance abuse and suicidal ideas. The patient is not nervous/anxious.     Past Medical History:  Past Medical History:  Diagnosis Date  . BRCA negative 05/2017   MyRisk neg  . Family history of breast cancer 05/2017   riskscore=18.9%/IBIS=25.4%  . Hypothyroidism   . PCOS (polycystic ovarian syndrome)   . Rosacea   . SVT (supraventricular tachycardia) ([facility])     Past Surgical History:   Past Surgical History:  Procedure Laterality Date  . CARDIAC ELECTROPHYSIOLOGY [place] AND ABLATION  1999  . CHOLECYSTECTOMY  06/2001  . COLONOSCOPY WITH PROPOFOL N/A 08/05/2017   Procedure: COLONOSCOPY WITH PROPOFOL;  Surgeon: Toledo, Benay Pike, MD;  Location: [facility] ENDOSCOPY;  Service: Gastroenterology;  Laterality: N/A;  . DILATION AND CURETTAGE OF UTERUS  multiple in 1997-98  . HYSTEROSCOPY     x 1  . TONSILLECTOMY AND ADENOIDECTOMY      Gynecologic History:  No LMP recorded. Patient is perimenopausal. Last Pap: Results were: 2020 NIL  Last mammogram: 2021 Results were: BI-RAD I  Obstetric History: [id number]  Family History:  Family History  Problem Relation Age of Onset  . Breast cancer Sister 28       pat half sister  . Breast cancer Maternal Grandmother 58  . Hypertension Mother   . Hyperlipidemia Mother   . Hypertension Father   . Hyperlipidemia Father   . Pancreatic cancer Paternal Grandmother 36  . Colon cancer Other     Social History:  Social History   Socioeconomic History  . Marital status: Married    Spouse name: Ruthann Cancer  . Number of children: 2  . Years of education: Not on file  . Highest education level: Not on file  Occupational History  . Occupation: Homemaker  Tobacco Use  . Smoking status: Never Smoker  . Smokeless tobacco: Never Used  Vaping Use  . Vaping Use: Never used  Substance and Sexual Activity  . Alcohol use: No  . Drug use: No  . Sexual activity: Yes    Partners: Male    Birth control/protection: Condom  Other Topics Concern  . Not on file  Social History Narrative  . Not on file   Social Determinants of Health   Financial Resource Strain: Not on file  Food Insecurity: Not on file  Transportation Needs: Not on file  Physical Activity: Not on file  Stress: Not on file  Social Connections: Not on file  Intimate Partner Violence: Not on file    Allergies:  No Known Allergies  Medications: Prior to Admission  medications   Medication Sig Start Date End Date Taking? Authorizing Provider  B Complex Vitamins (VITAMIN-B COMPLEX) TABS Take 1 tablet by mouth daily.   Yes [provider]  cetirizine (ZYRTEC) 10 MG tablet Take 10 mg by mouth daily.   Yes [provider]  Cholecalciferol (VITAMIN D3) 5000 units CAPS Take 1 capsule by mouth daily.   Yes [provider]  flecainide (TAMBOCOR) 50 MG tablet Take 50 mg by mouth 3 (three) times daily. Dr Burt Knack   Yes [provider]  Ivermectin 1 % CREA Apply 1 application topically daily. Dr Phillip Heal   Yes [provider]  levothyroxine (SYNTHROID) 200 MCG tablet Take 1 tablet by mouth daily. 05/26/19  Yes [provider]  metFORMIN (GLUCOPHAGE) 1000 MG tablet Take 1,000 mg by mouth 2 (two) times daily with a meal. [place] end[facility]   Yes [provider]  metoprolol tartrate (LOPRESSOR) 25 MG tablet Take 12.5 mg by mouth 2 (two) times daily. cooper   Yes [provider]  spironolactone (ALDACTONE) 100 MG tablet Take 1 tablet by mouth daily.   Yes [provider]  tazarotene (AVAGE) 0.1 % cream derm 03/24/17  Yes [provider]    Physical Exam Vitals: Blood pressure 118/72, height _0  (1.778 m), weight 205 lb 6.4 oz (93.2 kg).  Physical Exam Constitutional:      Appearance: She is well-developed.  Genitourinary:     Vagina and uterus normal.     There is no lesion on the right labia.     There is no lesion on the left labia.    No lesions in the vagina.     Genitourinary Comments: External: Normal appearing vulva. No lesions noted.  Speculum examination: Normal appearing cervix. No blood in the vaginal vault. No discharge.  Bimanual examination: Uterus midline, non-tender, normal in size, shape and contour.  No CMT. No adnexal masses. No adnexal tenderness. Pelvis not fixed.        Right Adnexa: no mass present.    Left Adnexa: no mass present.    No cervical motion  tenderness.  Breasts:     Right: No inverted nipple, mass, nipple discharge or skin change.     Left: No inverted nipple, mass, nipple discharge or skin change.    HENT:     Head: Normocephalic and atraumatic.  Eyes:     Extraocular Movements: EOM normal.  Neck:     Thyroid: No thyromegaly.  Cardiovascular:     Rate and Rhythm: Normal rate and regular rhythm.     Heart sounds: Normal heart sounds.  Pulmonary:     Effort: Pulmonary effort is normal.     Breath sounds: Normal breath sounds.  Abdominal:     General: Bowel sounds are normal. There is no distension.     Palpations: Abdomen is soft. There is no mass.  Musculoskeletal:     Cervical back: Neck supple.  Neurological:     Mental Status: She is alert and oriented to person, place,  and time.  Skin:    General: Skin is warm and dry.  Psychiatric:        Mood and Affect: Mood and affect normal.        Behavior: Behavior normal.        Thought Content: Thought content normal.        Judgment: Judgment normal.  Vitals reviewed.      Female chaperone present for pelvic and breast  portions of the physical exam  Assessment: 54 y.o. [id number] routine annual exam  Plan: Problem List Items Addressed This Visit      Other   Family history of breast cancer   Relevant Orders   MR BREAST W & WO CM SCREENING (GI)   Increased risk of breast cancer   Relevant Orders   MR BREAST W & WO CM SCREENING (GI)    Other Visit Diagnoses    Encounter for annual routine gynecological examination    -  Primary   Health maintenance examination       Breast cancer screening by mammogram       Relevant Orders   MM 3D SCREEN BREAST BILATERAL   MR BREAST W & WO CM SCREENING (GI)   Colon cancer screening       Cervical cancer screening       Relevant Orders   Cytology - PAP      1) Mammogram - recommend yearly screening mammogram.  Mammogram Was ordered today. Limited breast MR for intermediate risk of breast CA ordered as well.    2) STI screening was offered and declined.  3) ASCCP guidelines and rational discussed.  Patient opts for yearly screening interval.  4) Colonoscopy -- up tp date, last performed 2019. 85yrfollow up advised   5) Routine healthcare maintenance including cholesterol, diabetes screening discussed managed by endocrinologist  6) Osteoporosis screening - has had Dexa this year  CAdrian ProwsMD, FFridley CLearyGroup 05/31/2020 10:16 AM

## 2020-05-31 NOTE — Progress Notes (Signed)
Annual Exam

## 2020-05-31 NOTE — Patient Instructions (Addendum)
[employer] [place] for Calcium and Vitamin D  Age (yr) Calcium Recommended Dietary Allowance (mg/day) Vitamin D Recommended Dietary Allowance (international units/day)  9-18 1,300 600  19-50 1,000 600  51-70 1,200 600  71 and older 1,200 800  Data from Institute of Medicine. Dietary reference intakes: calcium, vitamin D. [place], [place]: [employer]; 2011.   Vaginal/ Vulvar Moisturizer Use 3-5 times a week at bedtime Hyalo Gyn Revaree Replens Isaiah Blakes Key-E suppositories Vitamin E oil, olive oil, coconut oil   Water-based Lubricants  Astroglide [place] Jelly  Luvena Aquagel  Silicone- based Lubricants Pjur PINK Astroglide silicone Uberlube   Exercising to Stay Healthy To become healthy and stay healthy, it is recommended that you do moderate-intensity and vigorous-intensity exercise. You can tell that you are exercising at a moderate intensity if your heart starts beating faster and you start breathing faster but can still hold a conversation. You can tell that you are exercising at a vigorous intensity if you are breathing much harder and faster and cannot hold a conversation while exercising. Exercising regularly is important. It has many health benefits, such as:  Improving overall fitness, flexibility, and endurance.  Increasing bone density.  Helping with weight control.  Decreasing body fat.  Increasing muscle strength.  Reducing stress and tension.  Improving overall health. How often should I exercise? Choose an activity that you enjoy, and set realistic goals. Your health care provider can help you make an activity plan that works for you. Exercise regularly as told by your health care provider. This may include:  Doing strength training two times a week, such as: ? Lifting weights. ? Using resistance bands. ? Push-ups. ? Sit-ups. ? Yoga.  Doing a certain intensity of exercise for a given amount of  time. Choose from these options: ? A total of 150 minutes of moderate-intensity exercise every week. ? A total of 75 minutes of vigorous-intensity exercise every week. ? A mix of moderate-intensity and vigorous-intensity exercise every week. Children, pregnant women, people who have not exercised regularly, people who are overweight, and older adults may need to talk with a health care provider about what activities are safe to do. If you have a medical condition, be sure to talk with your health care provider before you start a new exercise program. What are some exercise ideas? Moderate-intensity exercise ideas include:  Walking 1 mile (1.6 km) in about 15 minutes.  Biking.  Hiking.  Golfing.  Dancing.  Water aerobics. Vigorous-intensity exercise ideas include:  Walking 4.5 miles (7.2 km) or more in about 1 hour.  Jogging or running 5 miles (8 km) in about 1 hour.  Biking 10 miles (16.1 km) or more in about 1 hour.  Lap swimming.  Roller-skating or in-line skating.  Cross-country skiing.  Vigorous competitive sports, such as football, basketball, and soccer.  Jumping rope.  Aerobic dancing. What are some everyday activities that can help me to get exercise?  [place] work, such as: ? Pushing a Conservation officer, nature. ? Raking and bagging leaves.  Washing your car.  Pushing a stroller.  Shoveling snow.  Gardening.  Washing windows or floors. How can I be more active in my day-to-day activities?  Use stairs instead of an elevator.  Take a walk during your lunch break.  If you drive, park your car farther away from your work or school.  If you take public transportation, get off one stop early and walk the rest of the way.  Stand up or  walk around during all of your indoor phone calls.  Get up, stretch, and walk around every 30 minutes throughout the day.  Enjoy exercise with a friend. Support to continue exercising will help you keep a regular routine of  activity. What guidelines can I follow while exercising?  Before you start a new exercise program, talk with your health care provider.  Do not exercise so much that you hurt yourself, feel dizzy, or get very short of breath.  Wear comfortable clothes and wear shoes with good support.  Drink plenty of water while you exercise to prevent dehydration or heat stroke.  Work out until your breathing and your heartbeat get faster. Where to find more information  U.S. Department of Health and Human Services: BondedCompany.at  Centers for Disease Control and Prevention (CDC): http://www.wolf.info/ Summary  Exercising regularly is important. It will improve your overall fitness, flexibility, and endurance.  Regular exercise also will improve your overall health. It can help you control your weight, reduce stress, and improve your bone density.  Do not exercise so much that you hurt yourself, feel dizzy, or get very short of breath.  Before you start a new exercise program, talk with your health care provider. This information is not intended to replace advice given to you by your health care provider. Make sure you discuss any questions you have with your health care provider. Document Revised: 05/09/2017 Document Reviewed: 04/17/2017 Elsevier Patient Education  [place].   Budget-Friendly Healthy Eating There are many ways to save money at the grocery store and continue to eat healthy. You can be successful if you:  Plan meals according to your budget.  Make a grocery list and only purchase food according to your grocery list.  Prepare food yourself. What are tips for following this plan?  Reading food labels  Compare food labels between brand name foods and the store brand. Often the nutritional value is the same, but the store brand is lower cost.  Look for products that do not have added sugar, fat, or salt (sodium). These often cost the same but are healthier for you. Products may  be labeled as: ? Sugar-free. ? Nonfat. ? Low-fat. ? Sodium-free. ? Low-sodium.  Look for lean ground beef labeled as at least 92% lean and 8% fat. Shopping  Buy only the items on your grocery list and go only to the areas of the store that have the items on your list.  Use coupons only for foods and brands you normally buy. Avoid buying items you wouldn't normally buy simply because they are on sale.  Check online and in newspapers for weekly deals.  Buy healthy items from the bulk bins when available, such as herbs, spices, flour, pasta, nuts, and dried fruit.  Buy fruits and vegetables that are in season. Prices are usually lower on in-season produce.  Look at the unit price on the price tag. Use it to compare different brands and sizes to find out which item is the best deal.  Choose healthy items that are often low-cost, such as carrots, potatoes, apples, bananas, and oranges. Dried or canned beans are a low-cost protein source.  Buy in bulk and freeze extra food. Items you can buy in bulk include meats, fish, poultry, frozen fruits, and frozen vegetables.  Avoid buying "ready-to-eat" foods, such as pre-cut fruits and vegetables and pre-made salads.  If possible, shop around to discover where you can find the best prices. Consider other retailers such as dollar stores,  larger [employer], local fruit and vegetable stands, and farmers markets.  Do not shop when you are hungry. If you shop while hungry, it may be hard to stick to your list and budget.  Resist impulse buying. Use your grocery list as your official plan for the week.  Buy a variety of vegetables and fruits by purchasing fresh, frozen, and canned items.  Look at the top and bottom shelves for deals. Foods at eye level (eye level of an adult or child) are usually more expensive.  Be efficient with your time when shopping. The more time you spend at the store, the more money you are likely to spend.  To save  money when choosing more expensive foods like meats and dairy: ? Choose cheaper cuts of meat, such as bone-in chicken thighs and drumsticks instead of skinless and boneless chicken. When you are ready to prepare the chicken, you can remove the skin yourself to make it healthier. ? Choose lean meats like chicken or [place] instead of beef. ? Choose canned seafood, such as tuna, salmon, or sardines. ? Buy eggs as a low-cost source of protein. ? Buy dried beans and peas, such as lentils, split peas, or kidney beans instead of meats. Dried beans and peas are a good alternative source of protein. ? Buy the larger tubs of yogurt instead of individual-sized containers.  Choose water instead of sodas and other sweetened beverages.  Avoid buying chips, cookies, and other "junk food." These items are usually expensive and not healthy. Cooking  Make extra food and freeze the extras in meal-sized containers or in individual portions for fast meals and snacks.  Pre-cook on days when you have extra time to prepare meals in advance. You can keep these meals in the fridge or freezer and reheat for a quick meal.  When you come home from the grocery store, wash, peel, and cut fruits and vegetables so they are ready to use and eat. This will help reduce food waste. Meal planning  Do not eat out or get fast food. Prepare food at home.  Make a grocery list and make sure to bring it with you to the store. If you have a smart phone, you could use your phone to create your shopping list.  Plan meals and snacks according to a grocery list and budget you create.  Use leftovers in your meal plan for the week.  Look for recipes where you can cook once and make enough food for two meals.  Include budget-friendly meals like stews, casseroles, and stir-fry dishes.  Try some meatless meals or try "no cook" meals like salads.  Make sure that half your plate is filled with fruits or vegetables. Choose from fresh,  frozen, or canned fruits and vegetables. If eating canned, remember to rinse them before eating. This will remove any excess salt added for packaging. Summary  Eating healthy on a budget is possible if you plan your meals according to your budget, purchase according to your budget and grocery list, and prepare food yourself.  Tips for buying more food on a limited budget include buying generic brands, using coupons only for foods you normally buy, and buying healthy items from the bulk bins when available.  Tips for buying cheaper food to replace expensive food include choosing cheaper, lean cuts of meat, and buying dried beans and peas. This information is not intended to replace advice given to you by your health care provider. Make sure you discuss any questions you  have with your health care provider. Document Revised: 05/28/2017 Document Reviewed: 05/28/2017 Elsevier Patient Education  2020 [place] protect organs, store calcium, anchor muscles, and support the whole body. Keeping your bones strong is important, especially as you get older. You can take actions to help keep your bones strong and healthy. Why is keeping my bones healthy important?  Keeping your bones healthy is important because your body constantly replaces bone cells. Cells get old, and new cells take their place. As we age, we lose bone cells because the body may not be able to make enough new cells to replace the old cells. The amount of bone cells and bone tissue you have is referred to as bone mass. The higher your bone mass, the stronger your bones. The aging process leads to an overall loss of bone mass in the body, which can increase the likelihood of:  Joint pain and stiffness.  Broken bones.  A condition in which the bones become weak and brittle (osteoporosis). A large decline in bone mass occurs in older adults. In women, it occurs about the time of menopause. What actions can I  take to keep my bones healthy? Good health habits are important for maintaining healthy bones. This includes eating nutritious foods and exercising regularly. To have healthy bones, you need to get enough of the right minerals and vitamins. Most nutrition experts recommend getting these nutrients from the foods that you eat. In some cases, taking supplements may also be recommended. Doing certain types of exercise is also important for bone health. What are the nutritional recommendations for healthy bones?  Eating a well-balanced diet with plenty of calcium and vitamin D will help to protect your bones. Nutritional recommendations vary from person to person. Ask your health care provider what is healthy for you. Here are some general guidelines. Get enough calcium Calcium is the most important (essential) mineral for bone health. Most people can get enough calcium from their diet, but supplements may be recommended for people who are at risk for osteoporosis. Good sources of calcium include:  Dairy products, such as low-fat or nonfat milk, cheese, and yogurt.  Dark green leafy vegetables, such as bok choy and broccoli.  Calcium-fortified foods, such as orange juice, cereal, bread, soy beverages, and tofu products.  Nuts, such as almonds. Follow these recommended amounts for daily calcium intake:  Children, age 62-3: 700 mg.  Children, age 25-8: 1,000 mg.  Children, age 25-13: 1,300 mg.  Teens, age 70-18: 1,300 mg.  Adults, age 88-50: 1,000 mg.  Adults, age 37-70: ? Men: 1,000 mg. ? Women: 1,200 mg.  Adults, age 72 or older: 1,200 mg.  Pregnant and breastfeeding females: ? Teens: 1,300 mg. ? Adults: 1,000 mg. Get enough vitamin D Vitamin D is the most essential vitamin for bone health. It helps the body absorb calcium. Sunlight stimulates the skin to make vitamin D, so be sure to get enough sunlight. If you live in a cold climate or you do not get outside often, your health care  provider may recommend that you take vitamin D supplements. Good sources of vitamin D in your diet include:  Egg yolks.  Saltwater fish.  Milk and cereal fortified with vitamin D. Follow these recommended amounts for daily vitamin D intake:  Children and teens, age 62-18: 600 international units.  Adults, age 32 or younger: 400-800 international units.  Adults, age 61 or older: 800-1,000 international units. Get other important nutrients Other nutrients  that are important for bone health include:  Phosphorus. This mineral is found in meat, poultry, dairy foods, nuts, and legumes. The recommended daily intake for adult men and adult women is 700 mg.  Magnesium. This mineral is found in seeds, nuts, dark green vegetables, and legumes. The recommended daily intake for adult men is 400-420 mg. For adult women, it is 310-320 mg.  Vitamin K. This vitamin is found in green leafy vegetables. The recommended daily intake is 120 mg for adult men and 90 mg for adult women. What type of physical activity is best for building and maintaining healthy bones? Weight-bearing and strength-building activities are important for building and maintaining healthy bones. Weight-bearing activities cause muscles and bones to work against gravity. Strength-building activities increase the strength of the muscles that support bones. Weight-bearing and muscle-building activities include:  Walking and hiking.  Jogging and running.  Dancing.  Gym exercises.  Lifting weights.  Tennis and racquetball.  Climbing stairs.  Aerobics. Adults should get at least 30 minutes of moderate physical activity on most days. Children should get at least 60 minutes of moderate physical activity on most days. Ask your health care provider what type of exercise is best for you. How can I find out if my bone mass is low? Bone mass can be measured with an X-ray test called a bone mineral density (BMD) test. This test is  recommended for all women who are age 72 or older. It may also be recommended for:  Men who are age 43 or older.  People who are at risk for osteoporosis because of: ? Having bones that break easily. ? Having a long-term disease that weakens bones, such as kidney disease or rheumatoid arthritis. ? Having menopause earlier than normal. ? Taking medicine that weakens bones, such as steroids, thyroid hormones, or hormone treatment for breast cancer or prostate cancer. ? Smoking. ? Drinking three or more alcoholic drinks a day. If you find that you have a low bone mass, you may be able to prevent osteoporosis or further bone loss by changing your diet and lifestyle. Where can I find more information? For more information, check out the following websites:  [place]: AviationTales.fr  [employer] of Health: www.bones.SouthExposed.es  International Osteoporosis Foundation: Administrator.iofbonehealth.org Summary  The aging process leads to an overall loss of bone mass in the body, which can increase the likelihood of broken bones and osteoporosis.  Eating a well-balanced diet with plenty of calcium and vitamin D will help to protect your bones.  Weight-bearing and strength-building activities are also important for building and maintaining strong bones.  Bone mass can be measured with an X-ray test called a bone mineral density (BMD) test. This information is not intended to replace advice given to you by your health care provider. Make sure you discuss any questions you have with your health care provider. Document Revised: 06/23/2017 Document Reviewed: 06/23/2017 Elsevier Patient Education  2020 [employer].

## 2020-06-08 LAB — CYTOLOGY - PAP
Comment: NEGATIVE
Diagnosis: NEGATIVE
Diagnosis: REACTIVE
High risk HPV: NEGATIVE

## 2020-06-22 ENCOUNTER — Ambulatory Visit
Admission: RE | Admit: 2020-06-22 | Discharge: 2020-06-22 | Disposition: A | Discharge status: Home / Self Care | Payer: Managed Care, Other (non HMO) | Source: Ambulatory Visit | Attending: Obstetrics and Gynecology | Admitting: Obstetrics and Gynecology

## 2020-06-22 ENCOUNTER — Other Ambulatory Visit: Payer: Self-pay

## 2020-06-22 DIAGNOSIS — Z1231 Encounter for screening mammogram for malignant neoplasm of breast: Secondary | ICD-10-CM | POA: Insufficient documentation

## 2020-07-07 ENCOUNTER — Other Ambulatory Visit: Payer: Self-pay

## 2020-07-07 ENCOUNTER — Ambulatory Visit
Admission: RE | Admit: 2020-07-07 | Discharge: 2020-07-07 | Disposition: A | Discharge status: Home / Self Care | Payer: Managed Care, Other (non HMO) | Source: Ambulatory Visit | Attending: Obstetrics and Gynecology | Admitting: Obstetrics and Gynecology

## 2020-07-07 DIAGNOSIS — Z9189 Other specified personal risk factors, not elsewhere classified: Secondary | ICD-10-CM

## 2020-07-07 DIAGNOSIS — Z1231 Encounter for screening mammogram for malignant neoplasm of breast: Secondary | ICD-10-CM

## 2020-07-07 DIAGNOSIS — Z803 Family history of malignant neoplasm of breast: Secondary | ICD-10-CM

## 2020-07-07 MED ORDER — GADOBUTROL 1 MMOL/ML IV SOLN
9.0000 mL | Freq: Once | INTRAVENOUS | Status: AC | PRN
  Administered 2020-07-07: 9 mL via INTRAVENOUS

## 2021-05-09 ENCOUNTER — Other Ambulatory Visit: Payer: Self-pay | Admitting: Obstetrics and Gynecology

## 2021-05-09 DIAGNOSIS — Z1231 Encounter for screening mammogram for malignant neoplasm of breast: Secondary | ICD-10-CM

## 2021-06-06 ENCOUNTER — Encounter: Payer: Self-pay | Admitting: Obstetrics and Gynecology

## 2021-06-06 ENCOUNTER — Other Ambulatory Visit (HOSPITAL_COMMUNITY)
Admission: RE | Admit: 2021-06-06 | Discharge: 2021-06-06 | Disposition: A | Discharge status: Home / Self Care | Payer: Managed Care, Other (non HMO) | Source: Ambulatory Visit | Attending: Obstetrics and Gynecology | Admitting: Obstetrics and Gynecology

## 2021-06-06 ENCOUNTER — Ambulatory Visit (INDEPENDENT_AMBULATORY_CARE_PROVIDER_SITE_OTHER): Payer: Managed Care, Other (non HMO) | Admitting: Obstetrics and Gynecology

## 2021-06-06 ENCOUNTER — Other Ambulatory Visit: Payer: Self-pay

## 2021-06-06 VITALS — BP 124/70 | Ht 70.0 in | Wt 199.6 lb

## 2021-06-06 DIAGNOSIS — Z1231 Encounter for screening mammogram for malignant neoplasm of breast: Secondary | ICD-10-CM

## 2021-06-06 DIAGNOSIS — Z9189 Other specified personal risk factors, not elsewhere classified: Secondary | ICD-10-CM

## 2021-06-06 DIAGNOSIS — Z124 Encounter for screening for malignant neoplasm of cervix: Secondary | ICD-10-CM | POA: Insufficient documentation

## 2021-06-06 DIAGNOSIS — Z01419 Encounter for gynecological examination (general) (routine) without abnormal findings: Secondary | ICD-10-CM

## 2021-06-06 DIAGNOSIS — Z803 Family history of malignant neoplasm of breast: Secondary | ICD-10-CM | POA: Diagnosis not present

## 2021-06-06 DIAGNOSIS — N952 Postmenopausal atrophic vaginitis: Secondary | ICD-10-CM

## 2021-06-06 LAB — HM PAP SMEAR

## 2021-06-06 MED ORDER — IMVEXXY STARTER PACK 10 MCG VA INST: VAGINAL_INSERT | VAGINAL | 0 refills | Status: AC

## 2021-06-06 NOTE — Patient Instructions (Signed)
[employer] Recommended Dietary Allowances for Calcium and Vitamin D  Age (yr) Calcium Recommended Dietary Allowance (mg/day) Vitamin D Recommended Dietary Allowance (international units/day)  9-18 1,300 600  19-50 1,000 600  51-70 1,200 600  71 and older 1,200 800  Data from [employer]. Dietary reference intakes: calcium, vitamin D. [place], [place]: National Academies [employer]; 2011.   Exercising to Stay Healthy To become healthy and stay healthy, it is recommended that you do moderate-intensity and vigorous-intensity exercise. You can tell that you are exercising at a moderate intensity if your heart starts beating faster and you start breathing faster but can still hold a conversation. You can tell that you are exercising at a vigorous intensity if you are breathing much harder and faster and cannot hold a conversation while exercising. How can exercise benefit me? Exercising regularly is important. It has many health benefits, such as: Improving overall fitness, flexibility, and endurance. Increasing bone density. Helping with weight control. Decreasing body fat. Increasing muscle strength and endurance. Reducing stress and tension, anxiety, depression, or anger. Improving overall health. What guidelines should I follow while exercising? Before you start a new exercise program, talk with your health care provider. Do not exercise so much that you hurt yourself, feel dizzy, or get very short of breath. Wear comfortable clothes and wear shoes with good support. Drink plenty of water while you exercise to prevent dehydration or heat stroke. Work out until your breathing and your heartbeat get faster (moderate intensity). How often should I exercise? Choose an activity that you enjoy, and set realistic goals. Your health care provider can help you make an activity plan that is individually designed and works best for you. Exercise regularly as told by your health  care provider. This may include: Doing strength training two times a week, such as: Lifting weights. Using resistance bands. Push-ups. Sit-ups. Yoga. Doing a certain intensity of exercise for a given amount of time. Choose from these options: A total of 150 minutes of moderate-intensity exercise every week. A total of 75 minutes of vigorous-intensity exercise every week. A mix of moderate-intensity and vigorous-intensity exercise every week. Children, pregnant women, people who have not exercised regularly, people who are overweight, and older adults may need to talk with a health care provider about what activities are safe to perform. If you have a medical condition, be sure to talk with your health care provider before you start a new exercise program. What are some exercise ideas? Moderate-intensity exercise ideas include: Walking 1 mile (1.6 km) in about 15 minutes. Biking. Hiking. Golfing. Dancing. Water aerobics. Vigorous-intensity exercise ideas include: Walking 4.5 miles (7.2 km) or more in about 1 hour. Jogging or running 5 miles (8 km) in about 1 hour. Biking 10 miles (16.1 km) or more in about 1 hour. Lap swimming. Roller-skating or in-line skating. Cross-country skiing. Vigorous competitive sports, such as football, basketball, and soccer. Jumping rope. Aerobic dancing. What are some everyday activities that can help me get exercise? Yard work, such as: Pushing a lawn mower. Raking and bagging leaves. Washing your car. Pushing a stroller. Shoveling snow. Gardening. Washing windows or floors. How can I be more active in my day-to-day activities? Use stairs instead of an elevator. Take a walk during your lunch break. If you drive, park your car farther away from your work or school. If you take public transportation, get off one stop early and walk the rest of the way. Stand up or walk around during all of   your indoor phone calls. Get up, stretch, and walk  around every 30 minutes throughout the day. Enjoy exercise with a friend. Support to continue exercising will help you keep a regular routine of activity. Where to find more information You can find more information about exercising to stay healthy from: [place] Department of Health and Human Services: www.hhs.gov Centers for Disease Control and Prevention (CDC): www.cdc.gov Summary Exercising regularly is important. It will improve your overall fitness, flexibility, and endurance. Regular exercise will also improve your overall health. It can help you control your weight, reduce stress, and improve your bone density. Do not exercise so much that you hurt yourself, feel dizzy, or get very short of breath. Before you start a new exercise program, talk with your health care provider. This information is not intended to replace advice given to you by your health care provider. Make sure you discuss any questions you have with your health care provider. Document Revised: 09/22/2020 Document Reviewed: 09/22/2020 Elsevier Patient Education  2022 [employer]. Budget-Friendly Healthy Eating There are many ways to save money at the grocery store and continue to eat healthy. You can be successful if you: Plan meals according to your budget. Make a grocery list and only purchase food according to your grocery list. Prepare food yourself at home. What are tips for following this plan? Reading food labels Compare food labels between brand name foods and the store brand. Often the nutritional value is the same, but the store brand is lower cost. Look for products that do not have added sugar, fat, or salt (sodium). These often cost the same but are healthier for you. Products may be labeled as: Sugar-free. Nonfat. Low-fat. Sodium-free. Low-sodium. Look for lean ground beef labeled as at least 92% lean and 8% fat. Shopping  Buy only the items on your grocery list and go only to the areas of the store  that have the items on your list. Use coupons only for foods and brands you normally buy. Avoid buying items you wouldn't normally buy simply because they are on sale. Check online and in newspapers for weekly deals. Buy healthy items from the bulk bins when available, such as herbs, spices, flour, pasta, nuts, and dried fruit. Buy fruits and vegetables that are in season. Prices are usually lower on in-season produce. Look at the unit price on the price tag. Use it to compare different brands and sizes to find out which item is the best deal. Choose healthy items that are often low-cost, such as carrots, potatoes, apples, bananas, and oranges. Dried or canned beans are a low-cost protein source. Buy in bulk and freeze extra food. Items you can buy in bulk include meats, fish, poultry, frozen fruits, and frozen vegetables. Avoid buying "ready-to-eat" foods, such as pre-cut fruits and vegetables and pre-made salads. If possible, shop around to discover where you can find the best prices. Consider other retailers such as dollar stores, larger wholesale stores, local fruit and vegetable stands, and farmers markets. Do not shop when you are hungry. If you shop while hungry, it may be hard to stick to your list and budget. Resist impulse buying. Use your grocery list as your official plan for the week. Buy a variety of vegetables and fruits by purchasing fresh, frozen, and canned items. Look at the top and bottom shelves for deals. Foods at eye level (eye level of an adult or child) are usually more expensive. Be efficient with your time when shopping. The more time you   spend at the store, the more money you are likely to spend. To save money when choosing more expensive foods like meats and dairy: Choose cheaper cuts of meat, such as bone-in chicken thighs and drumsticks instead of skinless and boneless chicken. When you are ready to prepare the chicken, you can remove the skin yourself to make it  healthier. Choose lean meats like chicken or turkey instead of beef. Choose canned seafood, such as tuna, salmon, or sardines. Buy eggs as a low-cost source of protein. Buy dried beans and peas, such as lentils, split peas, or kidney beans instead of meats. Dried beans and peas are a good alternative source of protein. Buy the larger tubs of yogurt instead of individual-sized containers. Choose water instead of sodas and other sweetened beverages. Avoid buying chips, cookies, and other "junk food." These items are usually expensive and not healthy. Cooking Make extra food and freeze the extras in meal-sized containers or in individual portions for fast meals and snacks. Pre-cook on days when you have extra time to prepare meals in advance. You can keep these meals in the fridge or freezer and reheat for a quick meal. When you come home from the grocery store, wash, peel, and cut fruits and vegetables so they are ready to use and eat. This will help reduce food waste. Meal planning Do not eat out or get fast food. Prepare food at home. Make a grocery list and make sure to bring it with you to the store. If you have a smart phone, you could use your phone to create your shopping list. Plan meals and snacks according to a grocery list and budget you create. Use leftovers in your meal plan for the week. Look for recipes where you can cook once and make enough food for two meals. Prepare budget-friendly types of meals like stews, casseroles, and stir-fry dishes. Try some meatless meals or try "no cook" meals like salads. Make sure that half your plate is filled with fruits or vegetables. Choose from fresh, frozen, or canned fruits and vegetables. If eating canned, remember to rinse them before eating. This will remove any excess salt added for packaging. Summary Eating healthy on a budget is possible if you plan your meals according to your budget, purchase according to your budget and grocery list,  and prepare food yourself. Tips for buying more food on a limited budget include buying generic brands, using coupons only for foods you normally buy, and buying healthy items from the bulk bins when available. Tips for buying cheaper food to replace expensive food include choosing cheaper, lean cuts of meat, and buying dried beans and peas. This information is not intended to replace advice given to you by your health care provider. Make sure you discuss any questions you have with your health care provider. Document Revised: 03/09/2020 Document Reviewed: 03/09/2020 Elsevier Patient Education  2022 [employer]. Bone Health Bones protect organs, store calcium, anchor muscles, and support the whole body. Keeping your bones strong is important, especially as you get older. You can take actions to help keep your bones strong and healthy. Why is keeping my bones healthy important? Keeping your bones healthy is important because your body constantly replaces bone cells. Cells get old, and new cells take their place. As we age, we lose bone cells because the body may not be able to make enough new cells to replace the old cells. The amount of bone cells and bone tissue you have is referred to as   bone mass. The higher your bone mass, the stronger your bones. The aging process leads to an overall loss of bone mass in the body, which can increase the likelihood of: Broken bones. A condition in which the bones become weak and brittle (osteoporosis). A large decline in bone mass occurs in older adults. In women, it occurs about the time of menopause. What actions can I take to keep my bones healthy? Good health habits are important for maintaining healthy bones. This includes eating nutritious foods and exercising regularly. To have healthy bones, you need to get enough of the right minerals and vitamins. Most nutrition experts recommend getting these nutrients from the foods that you eat. In some cases,  taking supplements may also be recommended. Doing certain types of exercise is also important for bone health. What are the nutritional recommendations for healthy bones? Eating a well-balanced diet with plenty of calcium and vitamin D will help to protect your bones. Nutritional recommendations vary from person to person. Ask your health care provider what is healthy for you. Here are some general guidelines. Get enough calcium Calcium is the most important (essential) mineral for bone health. Most people can get enough calcium from their diet, but supplements may be recommended for people who are at risk for osteoporosis. Good sources of calcium include: Dairy products, such as low-fat or nonfat milk, cheese, and yogurt. Dark green leafy vegetables, such as bok choy and broccoli. Foods that have calcium added to them (are fortified). Foods that may be fortified with calcium include orange juice, cereal, bread, soy beverages, and tofu products. Nuts, such as almonds. Follow these recommended amounts for daily calcium intake: Infants, 0-6 months: 200 mg. Infants, 6-12 months: 260 mg. Children, age 1-3: 700 mg. Children, age 4-8: 1,000 mg. Children, age 9-13: 1,300 mg. Teens, age 14-18: 1,300 mg. Adults, age 19-50: 1,000 mg. Adults, age 51-70: Men: 1,000 mg. Women: 1,200 mg. Adults, age 71 or older: 1,200 mg. Pregnant and breastfeeding females: Teens: 1,300 mg. Adults: 1,000 mg. Get enough vitamin D Vitamin D is the most essential vitamin for bone health. It helps the body absorb calcium. Sunlight stimulates the skin to make vitamin D, so be sure to get enough sunlight. If you live in a cold climate or you do not get outside often, your health care provider may recommend that you take vitamin D supplements. Good sources of vitamin D in your diet include: Egg yolks. Saltwater fish. Milk and cereal fortified with vitamin D. Follow these recommended amounts for daily vitamin D  intake: Infants, 0-12 months: 400 international units (IU). Children and teens, age 1-18: 600 international units. Adults, age 59 or younger: 600 international units. Adults, age 60 or older: 600-1,000 international units. Get other important nutrients Other nutrients that are important for bone health include: Phosphorus. This mineral is found in meat, poultry, dairy foods, nuts, and legumes. The recommended daily intake for adult men and adult women is 700 mg. Magnesium. This mineral is found in seeds, nuts, dark green vegetables, and legumes. The recommended daily intake for adult men is 400-420 mg. For adult women, it is 310-320 mg. Vitamin K. This vitamin is found in green leafy vegetables. The recommended daily intake is 120 mcg for adult men and 90 mcg for adult women. What type of physical activity is best for building and maintaining healthy bones? Weight-bearing and strength-building activities are important for building and maintaining healthy bones. Weight-bearing activities cause muscles and bones to work against gravity. Strength-building activities   increase the strength of the muscles that support bones. Weight-bearing and muscle-building activities include: Walking and hiking. Jogging and running. Dancing. Gym exercises. Lifting weights. Tennis and racquetball. Climbing stairs. Aerobics. Adults should get at least 30 minutes of moderate physical activity on most days. Children should get at least 60 minutes of moderate physical activity on most days. Ask your health care provider what type of exercise is best for you. How can I find out if my bone mass is low? Bone mass can be measured with an X-ray test called a bone mineral density (BMD) test. This test is recommended for all women who are age 65 or older. It may also be recommended for: Men who are age 70 or older. People who are at risk for osteoporosis because of: Having a long-term disease that weakens bones, such as  kidney disease or rheumatoid arthritis. Having menopause earlier than normal. Taking medicine that weakens bones, such as steroids, thyroid hormones, or hormone treatment for breast cancer or prostate cancer. Smoking. Drinking three or more alcoholic drinks a day. Being underweight. Sedentary lifestyle. If you find that you have a low bone mass, you may be able to prevent osteoporosis or further bone loss by changing your diet and lifestyle. Where can I find more information? Bone Health & Osteoporosis Foundation: www.nof.org/patients National Institutes of Health: www.bones.nih.gov International Osteoporosis Foundation: www.iofbonehealth.org Summary The aging process leads to an overall loss of bone mass in the body, which can increase the likelihood of broken bones and osteoporosis. Eating a well-balanced diet with plenty of calcium and vitamin D will help to protect your bones. Weight-bearing and strength-building activities are also important for building and maintaining strong bones. Bone mass can be measured with an X-ray test called a bone mineral density (BMD) test. This information is not intended to replace advice given to you by your health care provider. Make sure you discuss any questions you have with your health care provider. Document Revised: 11/08/2020 Document Reviewed: 11/08/2020 Elsevier Patient Education  2022 [employer].  

## 2021-06-06 NOTE — Progress Notes (Signed)
Gynecology Annual Exam  PCP: Juline Patch, MD  Chief Complaint:  Chief Complaint  Patient presents with   Gynecologic Exam    Annual Exam    History of Present Illness: Patient is a 55 y.o. [id number] presents for annual exam. The patient has no complaints today.   LMP: Patient's last menstrual period was 03/10/2018. She denies postmenopausal bleeding or spotting  The patient is sexually active. She has dyspareunia.  Postcoital Bleeding: no   The patient does perform self breast exams.  There is notable family history of breast or ovarian cancer in her family.  The patient has regular exercise: walking and lifting weight 7 days a wekk  The patient denies current symptoms of depression.   PHQ-9: 0 GAD-7: 0   Review of Systems: Review of Systems  Constitutional:  Negative for chills, fever, malaise/fatigue and weight loss.  HENT:  Negative for congestion, hearing loss and sinus pain.   Eyes:  Negative for blurred vision and double vision.  Respiratory:  Negative for cough, sputum production, shortness of breath and wheezing.   Cardiovascular:  Negative for chest pain, palpitations, orthopnea and leg swelling.  Gastrointestinal:  Negative for abdominal pain, constipation, diarrhea, nausea and vomiting.  Genitourinary:  Negative for dysuria, flank pain, frequency, hematuria and urgency.  Musculoskeletal:  Negative for back pain, falls and joint pain.  Skin:  Negative for itching and rash.  Neurological:  Negative for dizziness and headaches.  Psychiatric/Behavioral:  Negative for depression, substance abuse and suicidal ideas. The patient is not nervous/anxious.    Past Medical History:  Past Medical History:  Diagnosis Date   BRCA negative 05/2017   MyRisk neg   Cancer ([place]) 2021   melanoma lt shoulder   Family history of breast cancer 05/2017   riskscore=18.9%/IBIS=25.4%   Hypothyroidism    PCOS (polycystic ovarian syndrome)    Rosacea    SVT (supraventricular  tachycardia) ([place])     Past Surgical History:  Past Surgical History:  Procedure Laterality Date   CARDIAC ELECTROPHYSIOLOGY [place]  06/2001   COLONOSCOPY WITH PROPOFOL N/A 08/05/2017   Procedure: COLONOSCOPY WITH PROPOFOL;  Surgeon: Toledo, Benay Pike, MD;  Location: [facility] ENDOSCOPY;  Service: Gastroenterology;  Laterality: N/A;   DILATION AND CURETTAGE OF UTERUS  multiple in 1997-98   HYSTEROSCOPY     x 1   TONSILLECTOMY AND ADENOIDECTOMY      Gynecologic History:  Patient's last menstrual period was 03/10/2018. Last Pap: Results were: 2021 NIL   Last mammogram: 2022 Results were: BI-RAD I  Obstetric History: [id number]  Family History:  Family History  Problem Relation Age of Onset   Breast cancer Sister 68       pat half sister   Breast cancer Maternal Grandmother 39   Hypertension Mother    Hyperlipidemia Mother    Hypertension Father    Hyperlipidemia Father    Pancreatic cancer Paternal Grandmother 29   Colon cancer Other     Social History:  Social History   Socioeconomic History   Marital status: Married    Spouse name: Ruthann Cancer   Number of children: 2   Years of education: Not on file   Highest education level: Not on file  Occupational History   Occupation: Homemaker  Tobacco Use   Smoking status: Never   Smokeless tobacco: Never  Vaping Use   Vaping Use: Never used  Substance and Sexual Activity   Alcohol use: No  Drug use: No   Sexual activity: Yes    Partners: Male    Birth control/protection: Condom  Other Topics Concern   Not on file  Social History Narrative   Not on file   Social Determinants of Health   Financial Resource Strain: Not on file  Food Insecurity: Not on file  Transportation Needs: Not on file  Physical Activity: Not on file  Stress: Not on file  Social Connections: Not on file  Intimate Partner Violence: Not on file    Allergies:  No Known Allergies  Medications: Prior to  Admission medications   Medication Sig Start Date End Date Taking? Authorizing Provider  B Complex Vitamins (VITAMIN-B COMPLEX) TABS Take 1 tablet by mouth daily.   Yes [provider]  cetirizine (ZYRTEC) 10 MG tablet Take 10 mg by mouth daily.   Yes [provider]  Cholecalciferol (VITAMIN D3) 5000 units CAPS Take 1 capsule by mouth daily.   Yes [provider]  Estradiol Starter Pack (IMVEXXY STARTER PACK) 10 MCG INST Place 1 each vaginally daily for 14 days, THEN 1 each 2 (two) times a week for 14 days. 06/06/21 07/04/21 Yes Lyrika Souders, Stefanie Libel, MD  flecainide (TAMBOCOR) 50 MG tablet Take 50 mg by mouth 3 (three) times daily. Dr Burt Knack   Yes [provider]  Ivermectin 1 % CREA Apply 1 application topically daily. Dr Phillip Heal   Yes [provider]  levothyroxine (SYNTHROID) 200 MCG tablet Take 1 tablet by mouth daily. 05/26/19  Yes [provider]  metFORMIN (GLUCOPHAGE) 1000 MG tablet Take 1,000 mg by mouth 2 (two) times daily with a meal. [place] [facility]   Yes [provider]  metoprolol tartrate (LOPRESSOR) 25 MG tablet Take 12.5 mg by mouth 2 (two) times daily. cooper   Yes [provider]  spironolactone (ALDACTONE) 100 MG tablet Take 1 tablet by mouth daily.   Yes [provider]  tazarotene (AVAGE) 0.1 % cream derm 03/24/17  Yes [provider]    Physical Exam Vitals: Blood pressure 124/70, height $RemoveBefore'5\' 10"'DhsjaCQvsZRIT$  (1.778 m), weight 199 lb 9.6 oz (90.5 kg), last menstrual period 03/10/2018.  Physical Exam Constitutional:      Appearance: She is well-developed.  Genitourinary:     Genitourinary Comments: External: Atrophic appearing vulva. No lesions noted.  Speculum examination: Normal appearing cervix. No blood in the vaginal vault. No discharge.   Bimanual examination: Uterus midline, non-tender, normal in size, shape and contour.  No CMT. No adnexal masses. No adnexal tenderness. Pelvis not  fixed.  Breast Exam: breast equal without skin changes, nipple discharge, breast lump or enlarged lymph nodes      HENT:     Head: Normocephalic and atraumatic.  Neck:     Thyroid: No thyromegaly.  Cardiovascular:     Rate and Rhythm: Normal rate and regular rhythm.     Heart sounds: Normal heart sounds.  Pulmonary:     Effort: Pulmonary effort is normal.     Breath sounds: Normal breath sounds.  Abdominal:     General: Bowel sounds are normal. There is no distension.     Palpations: Abdomen is soft. There is no mass.  Musculoskeletal:     Cervical back: Neck supple.  Neurological:     Mental Status: She is alert and oriented to person, place, and time.  Skin:    General: Skin is warm and dry.  Psychiatric:        Behavior: Behavior normal.  Thought Content: Thought content normal.        Judgment: Judgment normal.  Vitals reviewed.     Female chaperone present for pelvic and breast  portions of the physical exam  Assessment: 55 y.o. [id number] routine annual exam  Plan: Problem List Items Addressed This Visit       Other   Family history of breast cancer   Relevant Orders   MR BREAST BILATERAL W WO CONTRAST INC CAD   Increased risk of breast cancer   Relevant Orders   MR BREAST BILATERAL W WO CONTRAST INC CAD   Other Visit Diagnoses     Encounter for annual routine gynecological examination    -  Primary   Cervical cancer screening       Relevant Orders   Cytology - PAP   Breast cancer screening by mammogram       Vaginal atrophy       Relevant Medications   Estradiol Starter Pack (IMVEXXY STARTER PACK) 10 MCG INST       1) Mammogram - recommend yearly screening mammogram.  Mammogram Was ordered today. MRI for increased risk of breast cancer  2) STI screening was offered and declined.  3) ASCCP guidelines and rational discussed.  She would like to continue yearly pap smears. Pap today.   4) Colonoscopy -- Plan for 2024, 5 year follow up  recommended.   5) Routine healthcare maintenance including cholesterol, diabetes screening discussed managed by PCP  6) Osteoporosis screening - no increased risk factors.  7) Vulvar atrophy and pain- will start Imvexxy, follow up in 4 weeks.   Adrian Prows MD, [facility], [facility] 06/06/2021 12:28 PM

## 2021-06-08 LAB — CYTOLOGY - PAP
Comment: NEGATIVE
Diagnosis: NEGATIVE
High risk HPV: NEGATIVE

## 2021-06-25 ENCOUNTER — Other Ambulatory Visit: Payer: Self-pay

## 2021-06-25 ENCOUNTER — Ambulatory Visit
Admission: RE | Admit: 2021-06-25 | Discharge: 2021-06-25 | Disposition: A | Discharge status: Home / Self Care | Payer: Managed Care, Other (non HMO) | Source: Ambulatory Visit | Attending: Obstetrics and Gynecology | Admitting: Obstetrics and Gynecology

## 2021-06-25 DIAGNOSIS — Z1231 Encounter for screening mammogram for malignant neoplasm of breast: Secondary | ICD-10-CM | POA: Diagnosis present

## 2021-07-02 ENCOUNTER — Encounter: Payer: Self-pay | Admitting: Obstetrics and Gynecology

## 2021-07-05 ENCOUNTER — Other Ambulatory Visit: Payer: Self-pay | Admitting: Obstetrics and Gynecology

## 2021-07-05 DIAGNOSIS — Z803 Family history of malignant neoplasm of breast: Secondary | ICD-10-CM

## 2021-07-05 DIAGNOSIS — Z9189 Other specified personal risk factors, not elsewhere classified: Secondary | ICD-10-CM

## 2021-07-05 NOTE — Progress Notes (Signed)
Breast MRI ordered °

## 2021-07-05 NOTE — Telephone Encounter (Signed)
Patient is requesting the abbreviated breast MRI. I believe it is IMG 487. This is what she had done last year. Can you correct order?

## 2021-07-06 ENCOUNTER — Other Ambulatory Visit: Payer: Self-pay

## 2021-07-06 ENCOUNTER — Ambulatory Visit
Admission: RE | Admit: 2021-07-06 | Discharge: 2021-07-06 | Disposition: A | Discharge status: Home / Self Care | Payer: No Typology Code available for payment source | Source: Ambulatory Visit | Attending: Obstetrics and Gynecology | Admitting: Obstetrics and Gynecology

## 2021-07-06 DIAGNOSIS — Z9189 Other specified personal risk factors, not elsewhere classified: Secondary | ICD-10-CM

## 2021-07-06 DIAGNOSIS — Z803 Family history of malignant neoplasm of breast: Secondary | ICD-10-CM

## 2021-07-06 MED ORDER — GADOBUTROL 1 MMOL/ML IV SOLN
9.0000 mL | Freq: Once | INTRAVENOUS | Status: AC | PRN
  Administered 2021-07-06: 9 mL via INTRAVENOUS

## 2021-07-16 ENCOUNTER — Other Ambulatory Visit: Payer: Self-pay

## 2021-07-16 ENCOUNTER — Ambulatory Visit: Payer: Managed Care, Other (non HMO) | Admitting: Obstetrics and Gynecology

## 2021-07-16 ENCOUNTER — Encounter: Payer: Self-pay | Admitting: Obstetrics and Gynecology

## 2021-07-16 VITALS — BP 112/70 | Ht 70.0 in | Wt 197.8 lb

## 2021-07-16 DIAGNOSIS — N941 Unspecified dyspareunia: Secondary | ICD-10-CM

## 2021-07-16 DIAGNOSIS — N952 Postmenopausal atrophic vaginitis: Secondary | ICD-10-CM

## 2021-07-16 MED ORDER — IMVEXXY MAINTENANCE PACK 10 MCG VA INST: 1.0000 | VAGINAL_INSERT | VAGINAL | 11 refills | Status: DC

## 2021-07-16 NOTE — Progress Notes (Signed)
Patient ID: Hayley Hale, female   DOB: 01/04/1966, 56 y.o.   MRN: [id number]  Reason for Consult: Follow-up   Referred by Duanne Limerick, MD  Subjective:     HPI:  Hayley Hale is a 56 y.o. female she is following up regarding dyspareunia and vaginal atrophy.  She has been using the Imvexxy 10 mcg starting pack.  She notes significant improvement in her pain and discomfort.  Gynecological History  Patient's last menstrual period was 03/10/2018.  Past Medical History:  Diagnosis Date   BRCA negative 05/2017   MyRisk neg   Cancer ([facility]) 2021   melanoma lt shoulder   Family history of breast cancer 05/2017   riskscore=18.9%/IBIS=25.4%   Hypothyroidism    PCOS (polycystic ovarian syndrome)    Rosacea    SVT (supraventricular tachycardia) ([facility])    Family History  Problem Relation Age of Onset   Breast cancer Sister 38       pat half sister   Breast cancer Maternal Grandmother 74   Hypertension Mother    Hyperlipidemia Mother    Hypertension Father    Hyperlipidemia Father    Pancreatic cancer Paternal Grandmother 109   Colon cancer Other    Past Surgical History:  Procedure Laterality Date   CARDIAC ELECTROPHYSIOLOGY MAPPING AND [place]BLATION  1999   CHOLECYSTECTOMY  06/2001   COLONOSCOPY WITH PROPOFOL N/A 08/05/2017   Procedure: COLONOSCOPY WITH PROPOFOL;  Surgeon: Toledo, Boykin Nearing, MD;  Location: [facility] ENDOSCOPY;  Service: Gastroenterology;  Laterality: N/A;   DILATION AND CURETTAGE OF UTERUS  multiple in 1997-98   HYSTEROSCOPY     x 1   TONSILLECTOMY AND ADENOIDECTOMY      Short Social History:  Social History   Tobacco Use   Smoking status: Never   Smokeless tobacco: Never  Substance Use Topics   Alcohol use: No    No Known Allergies  Current Outpatient Medications  Medication Sig Dispense Refill   B Complex Vitamins (VITAMIN-B COMPLEX) TABS Take 1 tablet by mouth daily.     cetirizine (ZYRTEC) 10 MG tablet Take 10 mg by mouth daily.      Cholecalciferol (VITAMIN D3) 5000 units CAPS Take 1 capsule by mouth daily.     Estradiol (IMVEXXY MAINTENANCE PACK) 10 MCG INST Place 1 each vaginally 2 (two) times a week. 8 each 11   flecainide (TAMBOCOR) 50 MG tablet Take 50 mg by mouth 3 (three) times daily. Dr Queen Blossom MAINTENANCE PACK 10 MCG INST Place vaginally.     Ivermectin 1 % CREA Apply 1 application topically daily. Dr Cheree Ditto     levothyroxine (SYNTHROID) 200 MCG tablet Take 1 tablet by mouth daily.     metFORMIN (GLUCOPHAGE) 1000 MG tablet Take 1,000 mg by mouth 2 (two) times daily with a meal. [place] [facility]     metoprolol tartrate (LOPRESSOR) 25 MG tablet Take 12.5 mg by mouth 2 (two) times daily. cooper     spironolactone (ALDACTONE) 100 MG tablet Take 1 tablet by mouth daily.     tazarotene (AVAGE) 0.1 % cream derm  6   No current facility-administered medications for this visit.    Review of Systems  Constitutional: Negative for chills, fatigue, fever and unexpected weight change.  HENT: Negative for trouble swallowing.  Eyes: Negative for loss of vision.  Respiratory: Negative for cough, shortness of breath and wheezing.  Cardiovascular: Negative for chest pain, leg swelling, palpitations and syncope.  GI: Negative for abdominal pain, blood  in stool, diarrhea, nausea and vomiting.  GU: Negative for difficulty urinating, dysuria, frequency and hematuria.  Musculoskeletal: Negative for back pain, leg pain and joint pain.  Skin: Negative for rash.  Neurological: Negative for dizziness, headaches, light-headedness, numbness and seizures.  Psychiatric: Negative for behavioral problem, confusion, depressed mood and sleep disturbance.       Objective:  Objective   Vitals:   07/16/21 1417  BP: 112/70  Weight: 197 lb 12.8 oz (89.7 kg)  Height: $Remove'5\' 10"'RtPZGYX$  (1.778 m)   Body mass index is 28.38 kg/m.  Physical Exam Vitals and nursing note reviewed. Exam conducted with a chaperone present.  Constitutional:       Appearance: Normal appearance. She is well-developed.  HENT:     Head: Normocephalic and atraumatic.  Eyes:     Extraocular Movements: Extraocular movements intact.     Pupils: Pupils are equal, round, and reactive to light.  Cardiovascular:     Rate and Rhythm: Normal rate and regular rhythm.  Pulmonary:     Effort: Pulmonary effort is normal. No respiratory distress.     Breath sounds: Normal breath sounds.  Abdominal:     General: Abdomen is flat.     Palpations: Abdomen is soft.  Genitourinary:    Comments: External: Normal appearing vulva. Improved pigmentation Musculoskeletal:        General: No signs of injury.  Skin:    General: Skin is warm and dry.  Neurological:     Mental Status: She is alert and oriented to person, place, and time.  Psychiatric:        Behavior: Behavior normal.        Thought Content: Thought content normal.        Judgment: Judgment normal.    Assessment/Plan:     56 year old with vaginal atrophy and dyspareunia.  Improvement with the Imvexxy starter pack.  We will transition to the maintenance pack.  New prescription sent. Follow-up as needed  More than 10 minutes were spent face to face with the patient in the room, reviewing the medical record, labs and images, and coordinating care for the patient. The plan of management was discussed in detail and counseling was provided.    Adrian Prows MD [facility], [facility] 07/16/2021 2:33 PM

## 2022-05-06 ENCOUNTER — Other Ambulatory Visit: Payer: Self-pay

## 2022-05-06 DIAGNOSIS — N952 Postmenopausal atrophic vaginitis: Secondary | ICD-10-CM

## 2022-05-06 DIAGNOSIS — N941 Unspecified dyspareunia: Secondary | ICD-10-CM

## 2022-05-06 MED ORDER — IMVEXXY MAINTENANCE PACK 10 MCG VA INST: VAGINAL_INSERT | VAGINAL | 11 refills | Status: DC

## 2022-06-11 ENCOUNTER — Ambulatory Visit: Payer: Self-pay | Admitting: Obstetrics and Gynecology

## 2022-06-14 NOTE — Progress Notes (Unsigned)
PCP: Patient, No Pcp Per   No chief complaint on file.   HPI:      [name] is a 57 y.o. [id number] whose LMP was Patient's last menstrual period was 03/10/2018., presents today for her annual examination.  Her menses are {norm/abn:715}, lasting {number: 22536} days.  Dysmenorrhea {dysmen:716}. She {does:18564} have intermenstrual bleeding. She {does:18564} have vasomotor sx.   Sex activity: {sex active: 315163}. She does have vaginal dryness/atrophy/dyspareunia and uses imvexxy 10 mg with sx relief.  Last Pap: 06/06/21 Results were: no abnormalities /neg HPV DNA.  Hx of STDs: {STD hx:14358}  Last mammogram: 06/25/21  Results were: normal--routine follow-up in 12 months There is a FH of breast cancer in her MGM and pat 1/2 sister; also FH of pancreatic cancer in her PGM. There is no FH of ovarian cancer. : **** Pt is MyRisk neg 2018The patient {does:18564} do self-breast exams. Had neg breast MRI 07/06/21  Colonoscopy: 2019 at [facility]; Repeat due after 10*** years.  ***  Tobacco use: {tob:20664} Alcohol use: {Alcohol:11675} No drug use Exercise: {exercise:31265}  She {does:18564} get adequate calcium and Vitamin D in her diet.  Labs with PCP.   Patient Active Problem List   Diagnosis Date Noted   Increased risk of breast cancer 05/31/2018   Radial neuropathy, right 10/28/2017   Ulnar neuritis, right 10/28/2017   Family history of breast cancer 05/12/2017   PCOS (polycystic ovarian syndrome) 05/12/2017   SVT (supraventricular tachycardia) 05/12/2017   Rosacea    Hypothyroidism     Past Surgical History:  Procedure Laterality Date   CARDIAC ELECTROPHYSIOLOGY [place]  06/2001   COLONOSCOPY WITH PROPOFOL N/A 08/05/2017   Procedure: COLONOSCOPY WITH PROPOFOL;  Surgeon: Toledo, Benay Pike, MD;  Location: [facility] ENDOSCOPY;  Service: Gastroenterology;  Laterality: N/A;   DILATION AND CURETTAGE OF UTERUS  multiple in 1997-98    HYSTEROSCOPY     x 1   TONSILLECTOMY AND ADENOIDECTOMY      Family History  Problem Relation Age of Onset   Breast cancer Sister 46       pat half sister   Breast cancer Maternal Grandmother 31   Hypertension Mother    Hyperlipidemia Mother    Hypertension Father    Hyperlipidemia Father    Pancreatic cancer Paternal Grandmother 59   Colon cancer Other     Social History   Socioeconomic History   Marital status: Married    Spouse name: Ruthann Cancer   Number of children: 2   Years of education: Not on file   Highest education level: Not on file  Occupational History   Occupation: Homemaker  Tobacco Use   Smoking status: Never   Smokeless tobacco: Never  Vaping Use   Vaping Use: Never used  Substance and Sexual Activity   Alcohol use: No   Drug use: No   Sexual activity: Yes    Partners: Male    Birth control/protection: Condom  Other Topics Concern   Not on file  Social History Narrative   Not on file   Social Determinants of Health   Financial Resource Strain: Not on file  Food Insecurity: Not on file  Transportation Needs: Not on file  Physical Activity: Sufficiently Active (05/12/2017)   Exercise Vital Sign    Days of Exercise per Week: 5 days    Minutes of Exercise per Session: 60 min  Stress: No Stress Concern Present (05/12/2017)   [place] -  Occupational Stress Questionnaire    Feeling of Stress : Not at all  Social Connections: Moderately Integrated (05/12/2017)   Social Connection and Isolation Panel [NHANES]    Frequency of Communication with Friends and Family: More than three times a week    Frequency of Social Gatherings with Friends and Family: Once a week    Attends Religious Services: Never    Marine scientist or Organizations: Yes    Attends Music therapist: More than 4 times per year    Marital Status: Married  Human resources officer Violence: Not At Risk (05/12/2017)   Humiliation, Afraid, Rape,  and Kick questionnaire    Fear of Current or Ex-Partner: No    Emotionally Abused: No    Physically Abused: No    Sexually Abused: No     Current Outpatient Medications:    B Complex Vitamins (VITAMIN-B COMPLEX) TABS, Take 1 tablet by mouth daily., Disp: , Rfl:    cetirizine (ZYRTEC) 10 MG tablet, Take 10 mg by mouth daily., Disp: , Rfl:    Cholecalciferol (VITAMIN D3) 5000 units CAPS, Take 1 capsule by mouth daily., Disp: , Rfl:    Estradiol (IMVEXXY MAINTENANCE PACK) 10 MCG INST, Place 1 each vaginally 2 (two) times a week., Disp: 8 each, Rfl: 11   flecainide (TAMBOCOR) 50 MG tablet, Take 50 mg by mouth 3 (three) times daily. Dr Burt Knack, Disp: , Rfl:    IMVEXXY MAINTENANCE PACK 10 MCG INST, Place 1 insert vaginally twice weekly, Disp: 8 each, Rfl: 11   Ivermectin 1 % CREA, Apply 1 application topically daily. Dr Phillip Heal, Disp: , Rfl:    levothyroxine (SYNTHROID) 200 MCG tablet, Take 1 tablet by mouth daily., Disp: , Rfl:    metFORMIN (GLUCOPHAGE) 1000 MG tablet, Take 1,000 mg by mouth 2 (two) times daily with a meal. [place] [facility], Disp: , Rfl:    metoprolol tartrate (LOPRESSOR) 25 MG tablet, Take 12.5 mg by mouth 2 (two) times daily. cooper, Disp: , Rfl:    spironolactone (ALDACTONE) 100 MG tablet, Take 1 tablet by mouth daily., Disp: , Rfl:    tazarotene (AVAGE) 0.1 % cream, derm, Disp: , Rfl: 6     ROS:  Review of Systems BREAST: No symptoms    Objective: LMP 03/10/2018    OBGyn Exam  Results: No results found for this or any previous visit (from the past 24 hour(s)).  Assessment/Plan:  No diagnosis found.   No orders of the defined types were placed in this encounter.           GYN counsel {counseling: 16159}    F/U  No follow-ups on file.  Jerold Yoss B. Altagracia Rone, PA-C 06/14/2022 10:13 PM

## 2022-06-17 ENCOUNTER — Encounter: Payer: Self-pay | Admitting: Obstetrics and Gynecology

## 2022-06-17 ENCOUNTER — Other Ambulatory Visit (HOSPITAL_COMMUNITY)
Admission: RE | Admit: 2022-06-17 | Discharge: 2022-06-17 | Disposition: A | Discharge status: Home / Self Care | Payer: Managed Care, Other (non HMO) | Source: Ambulatory Visit | Attending: Obstetrics and Gynecology | Admitting: Obstetrics and Gynecology

## 2022-06-17 ENCOUNTER — Ambulatory Visit (INDEPENDENT_AMBULATORY_CARE_PROVIDER_SITE_OTHER): Payer: Managed Care, Other (non HMO) | Admitting: Obstetrics and Gynecology

## 2022-06-17 VITALS — BP 100/64 | Ht 70.0 in | Wt 197.0 lb

## 2022-06-17 DIAGNOSIS — N9089 Other specified noninflammatory disorders of vulva and perineum: Secondary | ICD-10-CM | POA: Insufficient documentation

## 2022-06-17 DIAGNOSIS — Z1211 Encounter for screening for malignant neoplasm of colon: Secondary | ICD-10-CM

## 2022-06-17 DIAGNOSIS — N941 Unspecified dyspareunia: Secondary | ICD-10-CM

## 2022-06-17 DIAGNOSIS — Z803 Family history of malignant neoplasm of breast: Secondary | ICD-10-CM

## 2022-06-17 DIAGNOSIS — Z01419 Encounter for gynecological examination (general) (routine) without abnormal findings: Secondary | ICD-10-CM

## 2022-06-17 DIAGNOSIS — Z9189 Other specified personal risk factors, not elsewhere classified: Secondary | ICD-10-CM

## 2022-06-17 DIAGNOSIS — N952 Postmenopausal atrophic vaginitis: Secondary | ICD-10-CM

## 2022-06-17 DIAGNOSIS — Z1231 Encounter for screening mammogram for malignant neoplasm of breast: Secondary | ICD-10-CM

## 2022-06-17 DIAGNOSIS — Z1239 Encounter for other screening for malignant neoplasm of breast: Secondary | ICD-10-CM

## 2022-06-17 NOTE — Patient Instructions (Addendum)
I value your feedback and you entrusting us with your care. If you get a [place] patient survey, I would appreciate you taking the time to let us know about your experience today. Thank you!  [facility]: [phone]      

## 2022-06-19 LAB — SURGICAL PATHOLOGY

## 2022-06-20 ENCOUNTER — Telehealth: Payer: Self-pay | Admitting: Obstetrics and Gynecology

## 2022-06-20 DIAGNOSIS — N9089 Other specified noninflammatory disorders of vulva and perineum: Secondary | ICD-10-CM

## 2022-06-20 MED ORDER — IMVEXXY MAINTENANCE PACK 10 MCG VA INST: VAGINAL_INSERT | VAGINAL | 3 refills | Status: DC

## 2022-06-20 MED ORDER — TRIAMCINOLONE ACETONIDE 0.5 % EX OINT: 1.0000 | TOPICAL_OINTMENT | Freq: Two times a day (BID) | CUTANEOUS | 0 refills | Status: DC

## 2022-06-20 NOTE — Telephone Encounter (Signed)
Pt aware of neg bx results for labial sx. Bx showed chronic inflammation. Pt with significant burning and discomfort since bx. Will try Rx triamcinolone oint BID for 2 wks, pt then to f/u via phone/MyChart with sx. If improved, can try sample prem vag crm ext while continuing imvexxy. If still sx, will refer to derm for further eval. Rx RF imvexxy to [facility] for price relief.   Meds ordered this encounter  Medications   triamcinolone ointment (KENALOG) 0.5 %    Sig: Apply 1 Application topically 2 (two) times daily for 14 days.    Dispense:  15 g    Refill:  0    Order Specific Question:   Supervising Provider    Answer:   Rubie Maid [[id number]]   IMVEXXY MAINTENANCE PACK 10 MCG INST    Sig: Place 1 insert vaginally twice weekly    Dispense:  24 each    Refill:  3    Order Specific Question:   Supervising Provider    Answer:   Renaldo Reel

## 2022-06-26 ENCOUNTER — Ambulatory Visit
Admission: RE | Admit: 2022-06-26 | Discharge: 2022-06-26 | Disposition: A | Discharge status: Home / Self Care | Payer: Managed Care, Other (non HMO) | Source: Ambulatory Visit | Attending: Obstetrics and Gynecology | Admitting: Obstetrics and Gynecology

## 2022-06-26 DIAGNOSIS — Z9189 Other specified personal risk factors, not elsewhere classified: Secondary | ICD-10-CM | POA: Diagnosis present

## 2022-06-26 DIAGNOSIS — Z803 Family history of malignant neoplasm of breast: Secondary | ICD-10-CM | POA: Diagnosis present

## 2022-06-26 DIAGNOSIS — Z1231 Encounter for screening mammogram for malignant neoplasm of breast: Secondary | ICD-10-CM

## 2022-07-03 ENCOUNTER — Encounter: Payer: Self-pay | Admitting: Obstetrics and Gynecology

## 2022-07-22 MED ORDER — PREMARIN 0.625 MG/GM VA CREA: TOPICAL_CREAM | VAGINAL | 0 refills | Status: DC

## 2022-09-24 ENCOUNTER — Other Ambulatory Visit: Payer: Self-pay | Admitting: Obstetrics and Gynecology

## 2022-10-03 ENCOUNTER — Other Ambulatory Visit: Payer: Self-pay | Admitting: Obstetrics and Gynecology

## 2022-10-10 ENCOUNTER — Encounter: Payer: Self-pay | Admitting: Obstetrics and Gynecology

## 2022-11-13 ENCOUNTER — Ambulatory Visit
Admission: RE | Admit: 2022-11-13 | Discharge: 2022-11-13 | Disposition: A | Discharge status: Home / Self Care | Payer: Managed Care, Other (non HMO) | Source: Ambulatory Visit | Attending: Obstetrics and Gynecology

## 2022-11-13 DIAGNOSIS — Z803 Family history of malignant neoplasm of breast: Secondary | ICD-10-CM

## 2022-11-13 DIAGNOSIS — Z1239 Encounter for other screening for malignant neoplasm of breast: Secondary | ICD-10-CM

## 2022-11-13 DIAGNOSIS — Z9189 Other specified personal risk factors, not elsewhere classified: Secondary | ICD-10-CM

## 2022-11-13 MED ORDER — GADOPICLENOL 0.5 MMOL/ML IV SOLN
10.0000 mL | Freq: Once | INTRAVENOUS | Status: AC | PRN
  Administered 2022-11-13: 10 mL via INTRAVENOUS

## 2023-01-13 ENCOUNTER — Ambulatory Visit: Payer: Managed Care, Other (non HMO) | Admitting: Anesthesiology

## 2023-01-13 ENCOUNTER — Encounter
Admission: RE | Disposition: A | Discharge status: Home / Self Care | Payer: Self-pay | Source: Home / Self Care | Attending: Gastroenterology

## 2023-01-13 ENCOUNTER — Encounter: Payer: Self-pay | Admitting: Gastroenterology

## 2023-01-13 ENCOUNTER — Ambulatory Visit
Admission: RE | Admit: 2023-01-13 | Discharge: 2023-01-13 | Disposition: A | Discharge status: Home / Self Care | Payer: Managed Care, Other (non HMO) | Attending: Gastroenterology | Admitting: Gastroenterology

## 2023-01-13 DIAGNOSIS — K635 Polyp of colon: Secondary | ICD-10-CM | POA: Diagnosis not present

## 2023-01-13 DIAGNOSIS — Z86010 Personal history of colonic polyps: Secondary | ICD-10-CM | POA: Diagnosis not present

## 2023-01-13 DIAGNOSIS — Z1211 Encounter for screening for malignant neoplasm of colon: Secondary | ICD-10-CM | POA: Diagnosis present

## 2023-01-13 DIAGNOSIS — Z8371 Family history of adenomatous and serrated polyps: Secondary | ICD-10-CM | POA: Insufficient documentation

## 2023-01-13 HISTORY — PX: COLONOSCOPY WITH PROPOFOL: SHX5780

## 2023-01-13 HISTORY — PX: POLYPECTOMY: SHX5525

## 2023-01-13 SURGERY — COLONOSCOPY WITH PROPOFOL
Anesthesia: General

## 2023-01-13 MED ORDER — SODIUM CHLORIDE 0.9 % IV SOLN: INTRAVENOUS | Status: DC

## 2023-01-13 MED ORDER — LIDOCAINE HCL (PF) 2 % IJ SOLN
INTRAMUSCULAR | Status: AC
  Filled 2023-01-13: qty 5

## 2023-01-13 MED ORDER — LIDOCAINE HCL (CARDIAC) PF 100 MG/5ML IV SOSY
PREFILLED_SYRINGE | INTRAVENOUS | Status: DC | PRN
  Administered 2023-01-13: 60 mg via INTRAVENOUS

## 2023-01-13 MED ORDER — PROPOFOL 500 MG/50ML IV EMUL
INTRAVENOUS | Status: DC | PRN
  Administered 2023-01-13: 125 ug/kg/min via INTRAVENOUS

## 2023-01-13 MED ORDER — PROPOFOL 10 MG/ML IV BOLUS
INTRAVENOUS | Status: AC
  Filled 2023-01-13: qty 20

## 2023-01-13 MED ORDER — PROPOFOL 10 MG/ML IV BOLUS
INTRAVENOUS | Status: DC | PRN
Start: 2023-01-13 — End: 2023-01-13
  Administered 2023-01-13 (×2): 50 mg via INTRAVENOUS
  Administered 2023-01-13: 100 mg via INTRAVENOUS

## 2023-01-13 NOTE — Transfer of Care (Signed)
Immediate Anesthesia Transfer of Care Note  Patient: Hayley Hale  Procedure(s) Performed: COLONOSCOPY WITH PROPOFOL  Patient Location: PACU  Anesthesia Type:General  Level of Consciousness: awake, oriented, and patient cooperative  Airway & Oxygen Therapy: Patient Spontanous Breathing  Post-op Assessment: Report given to RN and Post -op Vital signs reviewed and stable  Post vital signs: Reviewed and stable  Last Vitals:  Vitals Value Taken Time  BP 108/66 1619  Temp    Pulse 75 1619  Resp 22 1619  SpO2 99% 1619    Last Pain:  Vitals:   01/13/23 1353  TempSrc: Temporal  PainSc: 0-No pain         Complications: No notable events documented.

## 2023-01-13 NOTE — Op Note (Signed)
[facility] Gastroenterology Patient Name: Hayley Hale Procedure Date: 01/13/2023 3:44 PM MRN: [id number] Account #: [id number] Date of Birth: [birth year] Admit Type: Outpatient Age: 57 Room: [facility] ENDO ROOM 2 Gender: Female Note Status: Finalized Instrument Name: Colonoscope [id number] Procedure:             Colonoscopy Indications:           High risk colon cancer surveillance: Personal history                         of colonic polyps Providers:             Trenda Moots, DO Referring MD:          Ilona Sorrel. Copland (Referring MD) Medicines:             Monitored Anesthesia Care Complications:         No immediate complications. Estimated blood loss:                         Minimal. Procedure:             Pre-Anesthesia Assessment:                        - Prior to the procedure, a History and Physical was                         performed, and patient medications and allergies were                         reviewed. The patient is competent. The risks and                         benefits of the procedure and the sedation options and                         risks were discussed with the patient. All questions                         were answered and informed consent was obtained.                         Patient identification and proposed procedure were                         verified by the physician, the nurse, the anesthetist                         and the technician in the endoscopy suite. Mental                         Status Examination: alert and oriented. Airway                         Examination: normal oropharyngeal airway and neck                         mobility. Respiratory Examination: clear to  auscultation. CV Examination: RRR, no murmurs, no S3                         or S4. Prophylactic Antibiotics: The patient does not                         require prophylactic antibiotics. Prior                          Anticoagulants: The patient has taken no anticoagulant                         or antiplatelet agents. ASA Grade Assessment: III - A                         patient with severe systemic disease. After reviewing                         the risks and benefits, the patient was deemed in                         satisfactory condition to undergo the procedure. The                         anesthesia plan was to use monitored anesthesia care                         (MAC). Immediately prior to administration of                         medications, the patient was re-assessed for adequacy                         to receive sedatives. The heart rate, respiratory                         rate, oxygen saturations, blood pressure, adequacy of                         pulmonary ventilation, and response to care were                         monitored throughout the procedure. The physical                         status of the patient was re-assessed after the                         procedure.                        After obtaining informed consent, the colonoscope was                         passed under direct vision. Throughout the procedure,                         the patient's blood pressure, pulse, and oxygen  saturations were monitored continuously. The                         Colonoscope was introduced through the anus and                         advanced to the the cecum, identified by appendiceal                         orifice and ileocecal valve. The colonoscopy was                         somewhat difficult due to a redundant colon and                         significant looping. Successful completion of the                         procedure was aided by straightening and shortening                         the scope to obtain bowel loop reduction, applying                         abdominal pressure and lavage. The patient tolerated                         the procedure  well. The quality of the bowel                         preparation was evaluated using the BBPS [facility] Bowel                         Preparation Scale) with scores of: Right Colon = 3,                         Transverse Colon = 3 and Left Colon = 3 (entire mucosa                         seen well with no residual staining, small fragments                         of stool or opaque liquid). The total BBPS score                         equals 9. The ileocecal valve, appendiceal orifice,                         and rectum were photographed. Findings:      The perianal and digital rectal examinations were normal. Pertinent       negatives include normal sphincter tone.      Two sessile polyps were found in the sigmoid colon. The polyps were 1 to       2 mm in size. These polyps were removed with a jumbo cold forceps.       Resection and retrieval were complete. Estimated blood loss was minimal.      The exam was  otherwise without abnormality on direct and retroflexion       views. Impression:            - Two 1 to 2 mm polyps in the sigmoid colon, removed                         with a jumbo cold forceps. Resected and retrieved.                        - The examination was otherwise normal on direct and                         retroflexion views. Recommendation:        - Patient has a contact number available for                         emergencies. The signs and symptoms of potential                         delayed complications were discussed with the patient.                         Return to normal activities tomorrow. Written                         discharge instructions were provided to the patient.                        - Discharge patient to home.                        - Resume previous diet.                        - Continue present medications.                        - Await pathology results.                        - Repeat colonoscopy in 5 years for surveillance based                          on pathology results.                        - Return to referring physician as previously                         scheduled.                        - The findings and recommendations were discussed with                         the patient. Procedure Code(s):     --- Professional ---                        [phone], Colonoscopy, flexible; with biopsy, single or  multiple Diagnosis Code(s):     --- Professional ---                        Z83.71, Family history of colonic polyps                        D12.5, Benign neoplasm of sigmoid colon CPT copyright 2022 American Medical Association. All rights reserved. The codes documented in this report are preliminary and upon coder review may  be revised to meet current compliance requirements. Attending Participation:      I personally performed the entire procedure. Elfredia Nevins, DO Jaynie Collins DO, DO 01/13/2023 4:18:14 PM This report has been signed electronically. Number of Addenda: 0 Note Initiated On: 01/13/2023 3:44 PM Scope Withdrawal Time: 0 hours 12 minutes 32 seconds  Total Procedure Duration: 0 hours 23 minutes 58 seconds  Estimated Blood Loss:  Estimated blood loss was minimal.      [facility]

## 2023-01-13 NOTE — Anesthesia Postprocedure Evaluation (Signed)
Anesthesia Post Note  Patient: Hayley Hale  Procedure(s) Performed: COLONOSCOPY WITH PROPOFOL POLYPECTOMY  Patient location during evaluation: Endoscopy Anesthesia Type: General Level of consciousness: awake and alert Pain management: pain level controlled Vital Signs Assessment: post-procedure vital signs reviewed and stable Respiratory status: spontaneous breathing, nonlabored ventilation, respiratory function stable and patient connected to nasal cannula oxygen Cardiovascular status: blood pressure returned to baseline and stable Postop Assessment: no apparent nausea or vomiting Anesthetic complications: no   No notable events documented.   Last Vitals:  Vitals:   01/13/23 1618 01/13/23 1628  BP: 108/66 119/70  Pulse: 74 70  Resp: 20 18  Temp: 36.7 C   SpO2: 99% 100%    Last Pain:  Vitals:   01/13/23 1628  TempSrc:   PainSc: 0-No pain                 Corinda Gubler

## 2023-01-13 NOTE — Interval H&P Note (Signed)
History and Physical Interval Note: Correction- the patient reports she does not have a personal history of polyps. Only colonoscopy was in 2019 which was normal.  01/13/2023 3:42 PM  [name]  has presented today for surgery, with the diagnosis of V12.72 (ICD-9-CM) - Z86.010 (ICD-10-CM) - Personal history of colonic polyps.  The various methods of treatment have been discussed with the patient and family. After consideration of risks, benefits and other options for treatment, the patient has consented to  Procedure(s): COLONOSCOPY WITH PROPOFOL (N/A) as a surgical intervention.  The patient's history has been reviewed, patient examined, no change in status, stable for surgery.  I have reviewed the patient's chart and labs.  Questions were answered to the patient's satisfaction.     Hayley Hale

## 2023-01-13 NOTE — H&P (Signed)
Pre-Procedure H&P   Patient ID: Hayley Hale is a 57 y.o. female.  Gastroenterology Provider: Jaynie Collins, DO  Referring Provider: Tawni Pummel, PA PCP: Trenda Moots  Date: 01/13/2023  HPI [name] is a 57 y.o. female who presents today for Colonoscopy for Surveillance-personal history of colon polyps and fhx colon polyps .  Patient reports daily bowel movement without melena or hematochezia.  She has a family history of colon polyps in her mother father and brother.  Last underwent colonoscopy in February 2019 only demonstrating internal hemorrhoids.  Status postcholecystectomy   Past Medical History:  Diagnosis Date   BRCA negative 05/2017   MyRisk neg   Cancer (HCC) 2021   melanoma lt shoulder   Family history of breast cancer 05/2017   riskscore=18.9%/IBIS=25.4%   Hypothyroidism    PCOS (polycystic ovarian syndrome)    Rosacea    SVT (supraventricular tachycardia)     Past Surgical History:  Procedure Laterality Date   CARDIAC ELECTROPHYSIOLOGY MAPPING AND ABLATION  1999   CHOLECYSTECTOMY  06/2001   COLONOSCOPY WITH PROPOFOL N/A 08/05/2017   Procedure: COLONOSCOPY WITH PROPOFOL;  Surgeon: Toledo, Boykin Nearing, MD;  Location: [facility] ENDOSCOPY;  Service: Gastroenterology;  Laterality: N/A;   DILATION AND CURETTAGE OF UTERUS  multiple in 1997-98   HYSTEROSCOPY     x 1   TONSILLECTOMY AND ADENOIDECTOMY      Family History colon polyps in her mother father and brother. No other h/o GI disease or malignancy  Review of Systems  Constitutional:  Negative for activity change, appetite change, chills, diaphoresis, fatigue, fever and unexpected weight change.  HENT:  Negative for trouble swallowing and voice change.   Respiratory:  Negative for shortness of breath and wheezing.   Cardiovascular:  Negative for chest pain, palpitations and leg swelling.  Gastrointestinal:  Negative for abdominal distention, abdominal pain,  anal bleeding, blood in stool, constipation, diarrhea, nausea, rectal pain and vomiting.  Musculoskeletal:  Negative for arthralgias and myalgias.  Skin:  Negative for color change and pallor.  Neurological:  Negative for dizziness, syncope and weakness.  Psychiatric/Behavioral:  Negative for confusion.   All other systems reviewed and are negative.    Medications No current facility-administered medications on file prior to encounter.   Current Outpatient Medications on File Prior to Encounter  Medication Sig Dispense Refill   flecainide (TAMBOCOR) 50 MG tablet Take 50 mg by mouth 3 (three) times daily. Dr Excell Seltzer     levothyroxine (SYNTHROID) 200 MCG tablet Take 1 tablet by mouth daily.     metoprolol tartrate (LOPRESSOR) 25 MG tablet Take 12.5 mg by mouth 2 (two) times daily. cooper     ARAZLO 0.045 % LOTN Apply topically.     B Complex Vitamins (VITAMIN-B COMPLEX) TABS Take 1 tablet by mouth daily.     cetirizine (ZYRTEC) 10 MG tablet Take 10 mg by mouth daily.     Cholecalciferol (VITAMIN D3) 5000 units CAPS Take 1 capsule by mouth daily.     conjugated estrogens (PREMARIN) vaginal cream Apply small amount externally 2-3 times weekly as maintenance 30 g 0   IMVEXXY MAINTENANCE PACK 10 MCG INST Place 1 insert vaginally twice weekly 24 each 3   Ivermectin 1 % CREA Apply 1 application topically daily. Dr Cheree Ditto     metFORMIN (GLUCOPHAGE) 1000 MG tablet Take 1,000 mg by mouth 2 (two) times daily with a meal. [place] [facility]     spironolactone (ALDACTONE) 50 MG tablet Take  50 mg by mouth 2 (two) times daily.      Pertinent medications related to GI and procedure were reviewed by me with the patient prior to the procedure   Current Facility-Administered Medications:    0.9 %  sodium chloride infusion, , Intravenous, Continuous, Jaynie Collins, DO, Last Rate: 20 mL/hr at 01/13/23 1413, New Bag at 01/13/23 1413  sodium chloride 20 mL/hr at 01/13/23 1413       No Known  Allergies Allergies were reviewed by me prior to the procedure  Objective   Body mass index is 26.43 kg/m. Vitals:   01/13/23 1353  BP: 113/82  Pulse: 66  Resp: 18  Temp: (!) 96.3 F (35.7 C)  TempSrc: Temporal  SpO2: 100%  Weight: 83.6 kg  Height: 5\' 10"  (1.778 m)     Physical Exam Vitals and nursing note reviewed.  Constitutional:      General: She is not in acute distress.    Appearance: Normal appearance. She is not ill-appearing, toxic-appearing or diaphoretic.  HENT:     Head: Normocephalic and atraumatic.     Nose: Nose normal.     Mouth/Throat:     Mouth: Mucous membranes are moist.     Pharynx: Oropharynx is clear.  Eyes:     General: No scleral icterus.    Extraocular Movements: Extraocular movements intact.  Cardiovascular:     Rate and Rhythm: Normal rate and regular rhythm.     Heart sounds: Normal heart sounds. No murmur heard.    No friction rub. No gallop.  Pulmonary:     Effort: Pulmonary effort is normal. No respiratory distress.     Breath sounds: Normal breath sounds. No wheezing, rhonchi or rales.  Abdominal:     General: Bowel sounds are normal. There is no distension.     Palpations: Abdomen is soft.     Tenderness: There is no abdominal tenderness. There is no guarding or rebound.  Musculoskeletal:     Cervical back: Neck supple.     Right lower leg: No edema.     Left lower leg: No edema.  Skin:    General: Skin is warm and dry.     Coloration: Skin is not jaundiced or pale.  Neurological:     General: No focal deficit present.     Mental Status: She is alert and oriented to person, place, and time. Mental status is at baseline.  Psychiatric:        Mood and Affect: Mood normal.        Behavior: Behavior normal.        Thought Content: Thought content normal.        Judgment: Judgment normal.      Assessment:  [name] is a 57 y.o. female  who presents today for Colonoscopy for phx and fhx colon polyps  .  Plan:  Colonoscopy with possible intervention today  Colonoscopy with possible biopsy, control of bleeding, polypectomy, and interventions as necessary has been discussed with the patient/patient representative. Informed consent was obtained from the patient/patient representative after explaining the indication, nature, and risks of the procedure including but not limited to death, bleeding, perforation, missed neoplasm/lesions, cardiorespiratory compromise, and reaction to medications. Opportunity for questions was given and appropriate answers were provided. Patient/patient representative has verbalized understanding is amenable to undergoing the procedure.   Jaynie Collins, DO  [facility] Gastroenterology  Portions of the record may have been created with voice recognition software. Occasional wrong-word  or 'sound-a-like' substitutions may have occurred due to the inherent limitations of voice recognition software.  Read the chart carefully and recognize, using context, where substitutions may have occurred.

## 2023-01-13 NOTE — Anesthesia Preprocedure Evaluation (Addendum)
Anesthesia Evaluation  Patient identified by MRN, date of birth, ID band Patient awake    Reviewed: Allergy & Precautions, NPO status , Patient's Chart, lab work & pertinent test results  History of Anesthesia Complications Negative for: history of anesthetic complications  Airway Mallampati: I   Neck ROM: Full    Dental no notable dental hx.    Pulmonary neg pulmonary ROS   Pulmonary exam normal breath sounds clear to auscultation       Cardiovascular Normal cardiovascular exam+ dysrhythmias Supra Ventricular Tachycardia  Rhythm:Regular Rate:Normal  Stress test 03/10/19:   No significant ST segment changes or arrhythmias were noted during  stress   Overall, the patient's exercise capacity was excellent.   Negative ETT for ischemia at workload achieved   No significant chest pain symptoms reported   No significant arrhythmia noted     Neuro/Psych negative neurological ROS     GI/Hepatic negative GI ROS,,,  Endo/Other  Hypothyroidism  PCOS  Renal/GU negative Renal ROS     Musculoskeletal   Abdominal   Peds  Hematology Melanoma    Anesthesia Other Findings Cardiology note 12/30/22:  Assessment: 1. Atrial tachycardia Stable on metoprolol and flecainide therapy. Have told patient that she could try to take flecainide 100 mg in morning and 50 mg at night if needed to simplify her medical regimen.  2. Hypothyroidism Normal with last TSH level. Continues to follow-up with her primary care doctor.  3. High risk medication use Tolerating flecainide well. Taking 50 mg by mouth 3 times a day. Recent September 2021 stress test was normal.  Plan: Schedule Clinic Follow-Up in 6 months Continue Current Medical Regimen Antiarrhythmic Drug Risk: The risks and benefits of Flecainide for therapy for the treatment of symptomatic atrial fibrillation and supraventricular arrhythmias were discussed with the patient. The  risks of pro-arrhythmia were discussed. The need for routine evaluation for ischemic disease with at least stress testing every 2-3 years while on flecainide therapy was also discussed with the patient  Reproductive/Obstetrics                             Anesthesia Physical Anesthesia Plan  ASA: 3  Anesthesia Plan: General   Post-op Pain Management:    Induction: Intravenous  PONV Risk Score and Plan: 3 and Propofol infusion, TIVA and Treatment may vary due to age or medical condition  Airway Management Planned: Natural Airway  Additional Equipment:   Intra-op Plan:   Post-operative Plan:   Informed Consent: I have reviewed the patients History and Physical, chart, labs and discussed the procedure including the risks, benefits and alternatives for the proposed anesthesia with the patient or authorized representative who has indicated his/her understanding and acceptance.       Plan Discussed with: CRNA  Anesthesia Plan Comments: (LMA/GETA backup discussed.  Patient consented for risks of anesthesia including but not limited to:  - adverse reactions to medications - damage to eyes, teeth, lips or other oral mucosa - nerve damage due to positioning  - sore throat or hoarseness - damage to heart, brain, nerves, lungs, other parts of body or loss of life  Informed patient about role of CRNA in peri- and intra-operative care.  Patient voiced understanding.)        Anesthesia Quick Evaluation

## 2023-01-13 NOTE — Interval H&P Note (Signed)
History and Physical Interval Note: Preprocedure H&P from 01/13/23  was reviewed and there was no interval change after seeing and examining the patient.  Written consent was obtained from the patient after discussion of risks, benefits, and alternatives. Patient has consented to proceed with Colonoscopy with possible intervention   01/13/2023 3:42 PM  Hayley Hale  has presented today for surgery, with the diagnosis of V12.72 (ICD-9-CM) - Z86.010 (ICD-10-CM) - Personal history of colonic polyps.  The various methods of treatment have been discussed with the patient and family. After consideration of risks, benefits and other options for treatment, the patient has consented to  Procedure(s): COLONOSCOPY WITH PROPOFOL (N/A) as a surgical intervention.  The patient's history has been reviewed, patient examined, no change in status, stable for surgery.  I have reviewed the patient's chart and labs.  Questions were answered to the patient's satisfaction.     Jaynie Collins

## 2023-01-14 ENCOUNTER — Encounter: Payer: Self-pay | Admitting: Gastroenterology

## 2023-03-04 ENCOUNTER — Encounter: Payer: Self-pay | Admitting: Obstetrics and Gynecology

## 2023-03-25 ENCOUNTER — Other Ambulatory Visit: Payer: Self-pay | Admitting: Obstetrics and Gynecology

## 2023-05-13 ENCOUNTER — Other Ambulatory Visit: Payer: Self-pay | Admitting: Obstetrics and Gynecology

## 2023-05-13 DIAGNOSIS — Z1231 Encounter for screening mammogram for malignant neoplasm of breast: Secondary | ICD-10-CM

## 2023-06-22 NOTE — Progress Notes (Signed)
 PCP: Watt Bernarda NOVAK, PA-C   Chief Complaint  Patient presents with   Gynecologic Exam    Still having pain during intercourse    HPI:      Hayley Hale is a 58 y.o. [id number] whose LMP was Patient's last menstrual period was 03/10/2018., presents today for her annual examination.  Her menses are absent due to menopause. No PMB. Has tolerable vasomotor sx.   Sex activity: single partner, contraception - post menopausal status. She does have several yr hx of vaginal dryness/atrophy/dyspareunia, started imvexxy  10 mg 2023 with sx relief of pelvic and vaginal sx. Still had pain/discomfort external labia minora and post fourchette last yr and started on vag premarin  last yr used only externally 2-3 times weekly with sx improvement. Still has some pain occas at post fourchette, particularly in certain positions. Does use some oil externally vaginally for moisturizer, also uses lubricants with sex.  [name] had mentioned labial bx in past due to skin changes on exam; sx have persisted for a couple yrs externally without relief--bx done 2024 showed chronic inflammation, pt given Rx triamcinolone  crm with sx improvement, also improved with vag ERT. Area still red but not painful.   Last Pap: 06/06/21 Results were: no abnormalities /neg HPV DNA. No hx of HPV.  Last mammogram: 06/26/22 Results were: normal--routine follow-up in 12 months; has mammo appt 1/25.  Neg breast MRI 6/24. There is a FH of breast cancer in her MGM and pat 1/2 sister; also FH of pancreatic cancer in her PGM. There is no FH of ovarian cancer.  Pt is MyRisk neg 2018; IBIS=18.9%/riskscore=25.4%.The patient does not do self-breast exams. Had neg breast MRI 6/24 and does yearly (cash pay order at [facility]).  Colonoscopy: 8/24 and 2019 at [facility]; Repeat due after 5 years.  Tobacco use: The patient denies current or previous tobacco use. Alcohol use: none No drug use Exercise: moderately active  She does  get adequate calcium and Vitamin D in her diet.  Labs with endocrine/LC.    Patient Active Problem List   Diagnosis Date Noted   Dyspareunia in female 06/17/2022   Increased risk of breast cancer 05/31/2018   Radial neuropathy, right 10/28/2017   Ulnar neuritis, right 10/28/2017   Family history of breast cancer 05/12/2017   PCOS (polycystic ovarian syndrome) 05/12/2017   SVT (supraventricular tachycardia) (HCC) 05/12/2017   Rosacea    Hypothyroidism     Past Surgical History:  Procedure Laterality Date   CARDIAC ELECTROPHYSIOLOGY MAPPING AND ABLATION  1999   CHOLECYSTECTOMY  06/2001   COLONOSCOPY WITH PROPOFOL  N/A 08/05/2017   Procedure: COLONOSCOPY WITH PROPOFOL ;  Surgeon: Toledo, Ladell POUR, MD;  Location: [facility] ENDOSCOPY;  Service: Gastroenterology;  Laterality: N/A;   COLONOSCOPY WITH PROPOFOL  N/A 01/13/2023   Procedure: COLONOSCOPY WITH PROPOFOL ;  Surgeon: Onita Elspeth Sharper, DO;  Location: [facility] ENDOSCOPY;  Service: Gastroenterology;  Laterality: N/A;   DILATION AND CURETTAGE OF UTERUS  multiple in 1997-98   HYSTEROSCOPY     x 1   POLYPECTOMY  01/13/2023   Procedure: POLYPECTOMY;  Surgeon: Onita Elspeth Sharper, DO;  Location: [facility] ENDOSCOPY;  Service: Gastroenterology;;   TONSILLECTOMY AND ADENOIDECTOMY      Family History  Problem Relation Age of Onset   Hypertension Mother    Hyperlipidemia Mother    Lung cancer Mother 30   Hypertension Father    Hyperlipidemia Father    Breast cancer Sister 29       pat half sister  Breast cancer Maternal Grandmother 42   Pancreatic cancer Paternal Grandmother 20   Colon cancer Other     Social History   Socioeconomic History   Marital status: Married    Spouse name: Layman   Number of children: 2   Years of education: Not on file   Highest education level: Not on file  Occupational History   Occupation: Homemaker  Tobacco Use   Smoking status: Never   Smokeless tobacco: Never  Vaping Use   Vaping status: Never  Used  Substance and Sexual Activity   Alcohol use: No   Drug use: No   Sexual activity: Yes    Partners: Male    Birth control/protection: None  Other Topics Concern   Not on file  Social History Narrative   Not on file   Social Drivers of Health   Financial Resource Strain: Not on file  Food Insecurity: Not on file  Transportation Needs: Not on file  Physical Activity: Sufficiently Active (05/12/2017)   Exercise Vital Sign    Days of Exercise per Week: 5 days    Minutes of Exercise per Session: 60 min  Stress: No Stress Concern Present (05/12/2017)   [employer] of Occupational Health - Occupational Stress Questionnaire    Feeling of Stress : Not at all  Social Connections: Moderately Integrated (05/12/2017)   Social Connection and Isolation Panel [NHANES]    Frequency of Communication with Friends and Family: More than three times a week    Frequency of Social Gatherings with Friends and Family: Once a week    Attends Religious Services: Never    Database Administrator or Organizations: Yes    Attends Engineer, Structural: More than 4 times per year    Marital Status: Married  Catering Manager Violence: Not At Risk (05/12/2017)   Humiliation, Afraid, Rape, and Kick questionnaire    Fear of Current or Ex-Partner: No    Emotionally Abused: No    Physically Abused: No    Sexually Abused: No     Current Outpatient Medications:    ARAZLO 0.045 % LOTN, Apply topically., Disp: , Rfl:    B Complex Vitamins (VITAMIN-B COMPLEX) TABS, Take 1 tablet by mouth daily., Disp: , Rfl:    cetirizine (ZYRTEC) 10 MG tablet, Take 10 mg by mouth daily., Disp: , Rfl:    Cholecalciferol (VITAMIN D3) 5000 units CAPS, Take 1 capsule by mouth daily., Disp: , Rfl:    flecainide (TAMBOCOR) 50 MG tablet, Take 50 mg by mouth 3 (three) times daily. Dr Wonda, Disp: , Rfl:    Ivermectin 1 % CREA, Apply 1 application topically daily. Dr Arlyss, Disp: , Rfl:    levothyroxine (SYNTHROID) 200  MCG tablet, Take 1 tablet by mouth daily., Disp: , Rfl:    metFORMIN (GLUCOPHAGE) 1000 MG tablet, Take 1,000 mg by mouth 2 (two) times daily with a meal. [facility], Disp: , Rfl:    metoprolol tartrate (LOPRESSOR) 25 MG tablet, Take 12.5 mg by mouth 2 (two) times daily. cooper, Disp: , Rfl:    spironolactone (ALDACTONE) 50 MG tablet, Take 50 mg by mouth 2 (two) times daily., Disp: , Rfl:    conjugated estrogens  (PREMARIN ) vaginal cream, Apply small amount externally 2-3 times weekly as maintenance, Disp: 30 g, Rfl: 0   Estradiol  (IMVEXXY  MAINTENANCE PACK) 10 MCG INST, PLACE ONE (1) INSERT VAGINALLY TWICE WEEKLY, Disp: 24 each, Rfl: 3     ROS:  Review of Systems  Constitutional:  Negative  for fatigue, fever and unexpected weight change.  Respiratory:  Negative for cough, shortness of breath and wheezing.   Cardiovascular:  Negative for chest pain, palpitations and leg swelling.  Gastrointestinal:  Negative for blood in stool, constipation, diarrhea, nausea and vomiting.  Endocrine: Negative for cold intolerance, heat intolerance and polyuria.  Genitourinary:  Positive for dyspareunia. Negative for dysuria, flank pain, frequency, genital sores, hematuria, menstrual problem, pelvic pain, urgency, vaginal bleeding, vaginal discharge and vaginal pain.  Musculoskeletal:  Negative for back pain, joint swelling and myalgias.  Skin:  Negative for rash.  Neurological:  Negative for dizziness, syncope, light-headedness, numbness and headaches.  Hematological:  Negative for adenopathy.  Psychiatric/Behavioral:  Negative for agitation, confusion, sleep disturbance and suicidal ideas. The patient is not nervous/anxious.    BREAST: No symptoms    Objective: BP 98/65   Pulse 64   Ht 5' 10 (1.778 m)   Wt 191 lb (86.6 kg)   LMP 03/10/2018 Comment: 2020 LMP  BMI 27.41 kg/m    Physical Exam Constitutional:      Appearance: She is well-developed.  Genitourinary:     Vulva normal.     Right  Labia: No rash, tenderness, lesions or skin changes.    Left Labia: No tenderness, lesions, skin changes or rash.       No vaginal discharge, erythema or tenderness.     No vaginal atrophy present.     Right Adnexa: not tender and no mass present.    Left Adnexa: not tender and no mass present.    No cervical friability or polyp.     Uterus is not enlarged or tender.  Breasts:    Right: No mass, nipple discharge, skin change or tenderness.     Left: No mass, nipple discharge, skin change or tenderness.  Neck:     Thyroid: No thyromegaly.  Cardiovascular:     Rate and Rhythm: Normal rate and regular rhythm.     Heart sounds: Normal heart sounds. No murmur heard. Pulmonary:     Effort: Pulmonary effort is normal.     Breath sounds: Normal breath sounds.  Abdominal:     Palpations: Abdomen is soft.     Tenderness: There is no abdominal tenderness. There is no guarding or rebound.  Musculoskeletal:        General: Normal range of motion.     Cervical back: Normal range of motion.  Lymphadenopathy:     Cervical: No cervical adenopathy.  Neurological:     General: No focal deficit present.     Mental Status: She is alert and oriented to person, place, and time.     Cranial Nerves: No cranial nerve deficit.  Skin:    General: Skin is warm and dry.  Psychiatric:        Mood and Affect: Mood normal.        Behavior: Behavior normal.        Thought Content: Thought content normal.        Judgment: Judgment normal.  Vitals reviewed.     Assessment/Plan:  Encounter for annual routine gynecological examination  Encounter for screening mammogram for malignant neoplasm of breast; pt has mammo appt 1/25  Family history of breast cancer - Plan: MM 3D SCREEN BREAST BILATERAL; pt is MyRisk neg  Increased risk of breast cancer - Plan: MM 3D SCREEN BREAST BILATERAL, MR BREAST W & WO CM SCREENING (GI) ; pt aware of recommendations of monthly SBE, yearly CBE and mammos, as well as  yearly breast MRI. Pt has mammo appt, will call for breast MRI ref due 6/25. Cont Vit D supp.   Vaginal atrophy - Plan: Estradiol  (IMVEXXY  MAINTENANCE PACK) 10 MCG INST, conjugated estrogens  (PREMARIN ) vaginal cream; neg exam, cont imvexxy  internally (goes to [facility]) and prem vag ERT crm (goes to [facility]) externally only1-2 times wkly. Can do oil as moisturizer prn.   Dyspareunia in female - Plan: Estradiol  (IMVEXXY  MAINTENANCE PACK) 10 MCG INST, conjugated estrogens  (PREMARIN ) vaginal cream; post fourchette in certain positions. Cont imvexxy , increase prem vag crm to post fourchette only 4-5 times wkly, change positions, use abundance of lubricant there. F/u prn.   Meds ordered this encounter  Medications   Estradiol  (IMVEXXY  MAINTENANCE PACK) 10 MCG INST    Sig: PLACE ONE (1) INSERT VAGINALLY TWICE WEEKLY    Dispense:  24 each    Refill:  3    Supervising Provider:   CHERRY, ANIKA [[id number]]   conjugated estrogens  (PREMARIN ) vaginal cream    Sig: Apply small amount externally 2-3 times weekly as maintenance    Dispense:  30 g    Refill:  0    Supervising Provider:   CHERRY, ANIKA [[id number]]           GYN counsel breast self exam, mammography screening, menopause, adequate intake of calcium and vitamin D, diet and exercise    F/U  Return in about 1 year (around 06/22/2024).  Keahi Mccarney B. Geovana Gebel, PA-C 06/23/2023 10:23 AM

## 2023-06-23 ENCOUNTER — Encounter: Payer: Self-pay | Admitting: Obstetrics and Gynecology

## 2023-06-23 ENCOUNTER — Ambulatory Visit (INDEPENDENT_AMBULATORY_CARE_PROVIDER_SITE_OTHER): Payer: Managed Care, Other (non HMO) | Admitting: Obstetrics and Gynecology

## 2023-06-23 VITALS — BP 98/65 | HR 64 | Ht 70.0 in | Wt 191.0 lb

## 2023-06-23 DIAGNOSIS — Z1231 Encounter for screening mammogram for malignant neoplasm of breast: Secondary | ICD-10-CM

## 2023-06-23 DIAGNOSIS — Z9189 Other specified personal risk factors, not elsewhere classified: Secondary | ICD-10-CM

## 2023-06-23 DIAGNOSIS — Z01419 Encounter for gynecological examination (general) (routine) without abnormal findings: Secondary | ICD-10-CM | POA: Diagnosis not present

## 2023-06-23 DIAGNOSIS — N952 Postmenopausal atrophic vaginitis: Secondary | ICD-10-CM

## 2023-06-23 DIAGNOSIS — N941 Unspecified dyspareunia: Secondary | ICD-10-CM

## 2023-06-23 DIAGNOSIS — Z803 Family history of malignant neoplasm of breast: Secondary | ICD-10-CM

## 2023-06-23 DIAGNOSIS — Z1211 Encounter for screening for malignant neoplasm of colon: Secondary | ICD-10-CM

## 2023-06-23 MED ORDER — PREMARIN 0.625 MG/GM VA CREA: TOPICAL_CREAM | VAGINAL | 0 refills | Status: DC

## 2023-06-23 MED ORDER — IMVEXXY MAINTENANCE PACK 10 MCG VA INST: VAGINAL_INSERT | VAGINAL | 3 refills | Status: DC

## 2023-06-23 NOTE — Patient Instructions (Signed)
 I value your feedback and you entrusting [place] with your care. If you get a [place] patient survey, I would appreciate you taking the time to let us know about your experience today. Thank you! ? ? ?

## 2023-06-30 ENCOUNTER — Ambulatory Visit
Admission: RE | Admit: 2023-06-30 | Discharge: 2023-06-30 | Disposition: A | Discharge status: Home / Self Care | Payer: Managed Care, Other (non HMO) | Source: Ambulatory Visit | Attending: Obstetrics and Gynecology | Admitting: Obstetrics and Gynecology

## 2023-06-30 DIAGNOSIS — Z1231 Encounter for screening mammogram for malignant neoplasm of breast: Secondary | ICD-10-CM | POA: Diagnosis present

## 2023-07-03 ENCOUNTER — Encounter: Payer: Self-pay | Admitting: Obstetrics and Gynecology

## 2023-08-24 ENCOUNTER — Other Ambulatory Visit: Payer: Self-pay | Admitting: Obstetrics and Gynecology

## 2023-08-24 DIAGNOSIS — N952 Postmenopausal atrophic vaginitis: Secondary | ICD-10-CM

## 2023-08-24 DIAGNOSIS — N941 Unspecified dyspareunia: Secondary | ICD-10-CM

## 2023-10-21 ENCOUNTER — Encounter: Payer: Self-pay | Admitting: Obstetrics and Gynecology

## 2023-10-21 DIAGNOSIS — Z803 Family history of malignant neoplasm of breast: Secondary | ICD-10-CM

## 2023-10-21 DIAGNOSIS — Z1239 Encounter for other screening for malignant neoplasm of breast: Secondary | ICD-10-CM

## 2023-10-21 DIAGNOSIS — Z9189 Other specified personal risk factors, not elsewhere classified: Secondary | ICD-10-CM

## 2023-11-23 ENCOUNTER — Ambulatory Visit
Admission: RE | Admit: 2023-11-23 | Discharge: 2023-11-23 | Disposition: A | Discharge status: Home / Self Care | Source: Ambulatory Visit | Attending: Obstetrics and Gynecology | Admitting: Obstetrics and Gynecology

## 2023-11-23 DIAGNOSIS — Z1239 Encounter for other screening for malignant neoplasm of breast: Secondary | ICD-10-CM

## 2023-11-23 DIAGNOSIS — Z9189 Other specified personal risk factors, not elsewhere classified: Secondary | ICD-10-CM

## 2023-11-23 DIAGNOSIS — Z803 Family history of malignant neoplasm of breast: Secondary | ICD-10-CM

## 2023-11-23 MED ORDER — GADOPICLENOL 0.5 MMOL/ML IV SOLN
9.0000 mL | Freq: Once | INTRAVENOUS | Status: AC | PRN
  Administered 2023-11-23: 9 mL via INTRAVENOUS

## 2023-11-24 ENCOUNTER — Ambulatory Visit: Payer: Self-pay | Admitting: Obstetrics and Gynecology

## 2024-02-09 ENCOUNTER — Encounter: Payer: Self-pay | Admitting: Obstetrics and Gynecology

## 2024-02-09 DIAGNOSIS — Z79890 Hormone replacement therapy (postmenopausal): Secondary | ICD-10-CM

## 2024-02-10 ENCOUNTER — Other Ambulatory Visit: Payer: Self-pay | Admitting: Obstetrics and Gynecology

## 2024-02-10 DIAGNOSIS — N9089 Other specified noninflammatory disorders of vulva and perineum: Secondary | ICD-10-CM

## 2024-02-10 MED ORDER — TRIAMCINOLONE ACETONIDE 0.5 % EX OINT: 1.0000 | TOPICAL_OINTMENT | Freq: Two times a day (BID) | CUTANEOUS | 0 refills | Status: DC

## 2024-02-10 NOTE — Progress Notes (Signed)
 Rx RF triamcinolone  for vaginal pain

## 2024-03-02 MED ORDER — ESTRADIOL-NORETHINDRONE ACET 0.05-0.14 MG/DAY TD PTTW
1.0000 | MEDICATED_PATCH | TRANSDERMAL | 0 refills | Status: DC
Start: 2024-03-04 — End: 2024-04-12

## 2024-04-10 ENCOUNTER — Encounter: Payer: Self-pay | Admitting: Obstetrics and Gynecology

## 2024-04-12 ENCOUNTER — Other Ambulatory Visit: Payer: Self-pay | Admitting: Obstetrics and Gynecology

## 2024-04-12 DIAGNOSIS — Z79890 Hormone replacement therapy (postmenopausal): Secondary | ICD-10-CM

## 2024-04-12 MED ORDER — COMBIPATCH 0.05-0.14 MG/DAY TD PTTW: 1.0000 | MEDICATED_PATCH | TRANSDERMAL | 0 refills | Status: DC

## 2024-04-12 NOTE — Telephone Encounter (Signed)
 Appt log

## 2024-04-12 NOTE — Progress Notes (Signed)
 Rx RF HRT till 1/26 annual.

## 2024-04-17 ENCOUNTER — Other Ambulatory Visit: Payer: Self-pay | Admitting: Obstetrics and Gynecology

## 2024-04-17 DIAGNOSIS — N941 Unspecified dyspareunia: Secondary | ICD-10-CM

## 2024-04-17 DIAGNOSIS — N952 Postmenopausal atrophic vaginitis: Secondary | ICD-10-CM

## 2024-04-22 ENCOUNTER — Other Ambulatory Visit: Payer: Self-pay | Admitting: Obstetrics and Gynecology

## 2024-04-22 DIAGNOSIS — N941 Unspecified dyspareunia: Secondary | ICD-10-CM

## 2024-04-22 DIAGNOSIS — N952 Postmenopausal atrophic vaginitis: Secondary | ICD-10-CM

## 2024-05-17 ENCOUNTER — Other Ambulatory Visit: Payer: Self-pay | Admitting: Obstetrics and Gynecology

## 2024-05-17 DIAGNOSIS — Z1231 Encounter for screening mammogram for malignant neoplasm of breast: Secondary | ICD-10-CM

## 2024-06-22 NOTE — Progress Notes (Unsigned)
 "  PCP: Pcp, No   No chief complaint on file.   HPI:      [name] is a 59 y.o. [id number] whose LMP was Patient's last menstrual period was 03/10/2018., presents today for her annual examination.  Her menses are absent due to menopause. No PMB. Has tolerable vasomotor sx.   Sex activity: single partner, contraception - post menopausal status. She does have several yr hx of vaginal dryness/atrophy/dyspareunia, started imvexxy  10 mg 2023 with sx relief of pelvic and vaginal sx. Still had pain/discomfort external labia minora and post fourchette last yr and started on vag premarin  last yr used only externally 2-3 times weekly with sx improvement. Still has some pain occas at post fourchette, particularly in certain positions. Does use some oil externally vaginally for moisturizer, also uses lubricants with sex.  [name] had mentioned labial bx in past due to skin changes on exam; sx have persisted for a couple yrs externally without relief--bx done 2024 showed chronic inflammation, pt given Rx triamcinolone  crm with sx improvement, also improved with vag ERT. Area still red but not painful.   Last Pap: 06/06/21 Results were: no abnormalities /neg HPV DNA. No hx of HPV.  Last mammogram: 06/30/23 Results were: normal--routine follow-up in 12 months; has mammo appt 1/26.  Neg breast MRI 6/25 (does yearly at [facility]). There is a FH of breast cancer in her MGM and pat 1/2 sister; also FH of pancreatic cancer in her PGM. There is no FH of ovarian cancer.  Pt is MyRisk neg 2018; IBIS=18.9%/riskscore=25.4%.The patient does not do self-breast exams.  Colonoscopy: 8/24 and 2019 at [facility]; Repeat due after 5 years.  Tobacco use: The patient denies current or previous tobacco use. Alcohol use: none No drug use Exercise: moderately active  She does get adequate calcium and Vitamin D in her diet.  Labs with endocrine/LC.    Patient Active Problem List   Diagnosis Date Noted    Dyspareunia in female 06/17/2022   Increased risk of breast cancer 05/31/2018   Radial neuropathy, right 10/28/2017   Ulnar neuritis, right 10/28/2017   Family history of breast cancer 05/12/2017   PCOS (polycystic ovarian syndrome) 05/12/2017   SVT (supraventricular tachycardia) 05/12/2017   Rosacea    Hypothyroidism     Past Surgical History:  Procedure Laterality Date   CARDIAC ELECTROPHYSIOLOGY MAPPING AND ABLATION  1999   CHOLECYSTECTOMY  06/2001   COLONOSCOPY WITH PROPOFOL  N/A 08/05/2017   Procedure: COLONOSCOPY WITH PROPOFOL ;  Surgeon: Toledo, Ladell POUR, MD;  Location: [facility] ENDOSCOPY;  Service: Gastroenterology;  Laterality: N/A;   COLONOSCOPY WITH PROPOFOL  N/A 01/13/2023   Procedure: COLONOSCOPY WITH PROPOFOL ;  Surgeon: Onita Elspeth Sharper, DO;  Location: [facility] ENDOSCOPY;  Service: Gastroenterology;  Laterality: N/A;   DILATION AND CURETTAGE OF UTERUS  multiple in 1997-98   HYSTEROSCOPY     x 1   POLYPECTOMY  01/13/2023   Procedure: POLYPECTOMY;  Surgeon: Onita Elspeth Sharper, DO;  Location: [facility] ENDOSCOPY;  Service: Gastroenterology;;   TONSILLECTOMY AND ADENOIDECTOMY      Family History  Problem Relation Age of Onset   Hypertension Mother    Hyperlipidemia Mother    Lung cancer Mother 87   Hypertension Father    Hyperlipidemia Father    Breast cancer Sister 75       pat half sister   Breast cancer Maternal Grandmother 42   Pancreatic cancer Paternal Grandmother 87   Colon cancer Other     Social History  Socioeconomic History   Marital status: Married    Spouse name: Layman   Number of children: 2   Years of education: Not on file   Highest education level: Not on file  Occupational History   Occupation: Homemaker  Tobacco Use   Smoking status: Never   Smokeless tobacco: Never  Vaping Use   Vaping status: Never Used  Substance and Sexual Activity   Alcohol use: No   Drug use: No   Sexual activity: Yes    Partners: Male    Birth  control/protection: None  Other Topics Concern   Not on file  Social History Narrative   Not on file   Social Drivers of Health   Tobacco Use: Low Risk (02/23/2024)   Received from [facility]   Patient History    Smoking Tobacco Use: Never    Smokeless Tobacco Use: Never    Passive Exposure: Not on file  Financial Resource Strain: Not on file  Food Insecurity: Not on file  Transportation Needs: Not on file  Physical Activity: Not on file  Stress: Not on file  Social Connections: Not on file  Intimate Partner Violence: Not on file  Depression (EYV7-0): Not on file  Alcohol Screen: Not on file  Housing: Not on file  Utilities: Not on file  Health Literacy: Not on file     Current Outpatient Medications:    ARAZLO 0.045 % LOTN, Apply topically., Disp: , Rfl:    B Complex Vitamins (VITAMIN-B COMPLEX) TABS, Take 1 tablet by mouth daily., Disp: , Rfl:    cetirizine (ZYRTEC) 10 MG tablet, Take 10 mg by mouth daily., Disp: , Rfl:    Cholecalciferol (VITAMIN D3) 5000 units CAPS, Take 1 capsule by mouth daily., Disp: , Rfl:    conjugated estrogens  (PREMARIN ) vaginal cream, APPLY SMALL AMOUNT TOPICALLY 2  TO 3 TIMES WEEKLY AS MAINTENANCE, Disp: 30 g, Rfl: 0   Estradiol  (IMVEXXY  MAINTENANCE PACK) 10 MCG INST, PLACE ONE (1) INSERT VAGINALLY TWICE WEEKLY, Disp: 24 each, Rfl: 3   estradiol -norethindrone  (COMBIPATCH ) 0.05-0.14 MG/DAY, Place 1 patch onto the skin 2 (two) times a week., Disp: 24 patch, Rfl: 0   flecainide (TAMBOCOR) 50 MG tablet, Take 50 mg by mouth 3 (three) times daily. Dr Wonda, Disp: , Rfl:    Ivermectin 1 % CREA, Apply 1 application topically daily. Dr Arlyss, Disp: , Rfl:    levothyroxine (SYNTHROID) 200 MCG tablet, Take 1 tablet by mouth daily., Disp: , Rfl:    metFORMIN (GLUCOPHAGE) 1000 MG tablet, Take 1,000 mg by mouth 2 (two) times daily with a meal. [facility] [facility], Disp: , Rfl:    metoprolol tartrate (LOPRESSOR) 25 MG tablet, Take 12.5 mg by mouth 2 (two) times  daily. cooper, Disp: , Rfl:    spironolactone (ALDACTONE) 50 MG tablet, Take 50 mg by mouth 2 (two) times daily., Disp: , Rfl:      ROS:  Review of Systems  Constitutional:  Negative for fatigue, fever and unexpected weight change.  Respiratory:  Negative for cough, shortness of breath and wheezing.   Cardiovascular:  Negative for chest pain, palpitations and leg swelling.  Gastrointestinal:  Negative for blood in stool, constipation, diarrhea, nausea and vomiting.  Endocrine: Negative for cold intolerance, heat intolerance and polyuria.  Genitourinary:  Positive for dyspareunia. Negative for dysuria, flank pain, frequency, genital sores, hematuria, menstrual problem, pelvic pain, urgency, vaginal bleeding, vaginal discharge and vaginal pain.  Musculoskeletal:  Negative for back pain, joint swelling and myalgias.  Skin:  Negative for rash.  Neurological:  Negative for dizziness, syncope, light-headedness, numbness and headaches.  Hematological:  Negative for adenopathy.  Psychiatric/Behavioral:  Negative for agitation, confusion, sleep disturbance and suicidal ideas. The patient is not nervous/anxious.    BREAST: No symptoms    Objective: LMP 03/10/2018 Comment: 2020 LMP   Physical Exam Constitutional:      Appearance: She is well-developed.  Genitourinary:     Vulva normal.     Right Labia: No rash, tenderness, lesions or skin changes.    Left Labia: No tenderness, lesions, skin changes or rash.    No vaginal discharge, erythema or tenderness.     No vaginal atrophy present.     Right Adnexa: not tender and no mass present.    Left Adnexa: not tender and no mass present.    No cervical friability or polyp.     Uterus is not enlarged or tender.  Breasts:    Right: No mass, nipple discharge, skin change or tenderness.     Left: No mass, nipple discharge, skin change or tenderness.  Neck:     Thyroid: No thyromegaly.  Cardiovascular:     Rate and Rhythm: Normal rate and  regular rhythm.     Heart sounds: Normal heart sounds. No murmur heard. Pulmonary:     Effort: Pulmonary effort is normal.     Breath sounds: Normal breath sounds.  Abdominal:     Palpations: Abdomen is soft.     Tenderness: There is no abdominal tenderness. There is no guarding or rebound.  Musculoskeletal:        General: Normal range of motion.     Cervical back: Normal range of motion.  Lymphadenopathy:     Cervical: No cervical adenopathy.  Neurological:     General: No focal deficit present.     Mental Status: She is alert and oriented to person, place, and time.     Cranial Nerves: No cranial nerve deficit.  Skin:    General: Skin is warm and dry.  Psychiatric:        Mood and Affect: Mood normal.        Behavior: Behavior normal.        Thought Content: Thought content normal.        Judgment: Judgment normal.  Vitals reviewed.     Assessment/Plan:  Encounter for annual routine gynecological examination  Encounter for screening mammogram for malignant neoplasm of breast; pt has mammo appt 1/25  Family history of breast cancer - Plan: MM 3D SCREEN BREAST BILATERAL; pt is MyRisk neg  Increased risk of breast cancer - Plan: MM 3D SCREEN BREAST BILATERAL, MR BREAST W & WO CM SCREENING (GI) ; pt aware of recommendations of monthly SBE, yearly CBE and mammos, as well as yearly breast MRI. Pt has mammo appt, will call for breast MRI ref due 6/25. Cont Vit D supp.   Vaginal atrophy - Plan: Estradiol  (IMVEXXY  MAINTENANCE PACK) 10 MCG INST, conjugated estrogens  (PREMARIN ) vaginal cream; neg exam, cont imvexxy  internally (goes to [facility]) and prem vag ERT crm (goes to [facility]) externally only1-2 times wkly. Can do oil as moisturizer prn.   Dyspareunia in female - Plan: Estradiol  (IMVEXXY  MAINTENANCE PACK) 10 MCG INST, conjugated estrogens  (PREMARIN ) vaginal cream; post fourchette in certain positions. Cont imvexxy , increase prem vag crm to post fourchette only 4-5 times wkly,  change positions, use abundance of lubricant there. F/u prn.   No orders of the defined types were placed in this  encounter.          GYN counsel breast self exam, mammography screening, menopause, adequate intake of calcium and vitamin D, diet and exercise    F/U  No follow-ups on file.  Bharat Antillon B. Katlynn Naser, PA-C 06/22/2024 11:19 AM "

## 2024-06-24 ENCOUNTER — Ambulatory Visit: Admitting: Obstetrics and Gynecology

## 2024-06-24 ENCOUNTER — Encounter: Payer: Self-pay | Admitting: Obstetrics and Gynecology

## 2024-06-24 VITALS — BP 102/69 | HR 70 | Ht 70.0 in | Wt 200.0 lb

## 2024-06-24 DIAGNOSIS — N9089 Other specified noninflammatory disorders of vulva and perineum: Secondary | ICD-10-CM

## 2024-06-24 DIAGNOSIS — N952 Postmenopausal atrophic vaginitis: Secondary | ICD-10-CM

## 2024-06-24 DIAGNOSIS — Z01419 Encounter for gynecological examination (general) (routine) without abnormal findings: Secondary | ICD-10-CM

## 2024-06-24 DIAGNOSIS — Z01411 Encounter for gynecological examination (general) (routine) with abnormal findings: Secondary | ICD-10-CM

## 2024-06-24 DIAGNOSIS — Z124 Encounter for screening for malignant neoplasm of cervix: Secondary | ICD-10-CM

## 2024-06-24 DIAGNOSIS — Z803 Family history of malignant neoplasm of breast: Secondary | ICD-10-CM

## 2024-06-24 DIAGNOSIS — Z1231 Encounter for screening mammogram for malignant neoplasm of breast: Secondary | ICD-10-CM

## 2024-06-24 DIAGNOSIS — N941 Unspecified dyspareunia: Secondary | ICD-10-CM | POA: Diagnosis not present

## 2024-06-24 DIAGNOSIS — Z1151 Encounter for screening for human papillomavirus (HPV): Secondary | ICD-10-CM | POA: Diagnosis not present

## 2024-06-24 DIAGNOSIS — Z79890 Hormone replacement therapy (postmenopausal): Secondary | ICD-10-CM

## 2024-06-24 DIAGNOSIS — Z9189 Other specified personal risk factors, not elsewhere classified: Secondary | ICD-10-CM

## 2024-06-24 MED ORDER — COMBIPATCH 0.05-0.14 MG/DAY TD PTTW: 1.0000 | MEDICATED_PATCH | TRANSDERMAL | 3 refills | Status: AC

## 2024-06-24 MED ORDER — TRIAMCINOLONE ACETONIDE 0.5 % EX OINT: 1.0000 | TOPICAL_OINTMENT | Freq: Two times a day (BID) | CUTANEOUS | 2 refills | Status: AC

## 2024-06-24 MED ORDER — IMVEXXY MAINTENANCE PACK 10 MCG VA INST: VAGINAL_INSERT | VAGINAL | 3 refills | Status: AC

## 2024-06-24 NOTE — Patient Instructions (Signed)
 I value your feedback and you entrusting [place] with your care. If you get a [place] patient survey, I would appreciate you taking the time to let us know about your experience today. Thank you! ? ? ?

## 2024-06-28 LAB — IGP, APTIMA HPV: HPV Aptima: NEGATIVE

## 2024-06-30 ENCOUNTER — Ambulatory Visit
Admission: RE | Admit: 2024-06-30 | Discharge: 2024-06-30 | Disposition: A | Discharge status: Home / Self Care | Source: Ambulatory Visit | Attending: Obstetrics and Gynecology | Admitting: Obstetrics and Gynecology

## 2024-06-30 DIAGNOSIS — Z1231 Encounter for screening mammogram for malignant neoplasm of breast: Secondary | ICD-10-CM | POA: Diagnosis present

## 2024-07-01 ENCOUNTER — Ambulatory Visit: Payer: Self-pay | Admitting: Obstetrics and Gynecology
